# Patient Record
Sex: Female | Born: 1965 | Race: Black or African American | Hispanic: No | Marital: Married | Smoking: Never smoker
Health system: Southern US, Community
[De-identification: ages and names within clinical notes are randomized; demographics above are authoritative.]

## PROBLEM LIST (undated history)

## (undated) ENCOUNTER — Emergency Department (HOSPITAL_COMMUNITY): Admission: EM | Payer: Managed Care, Other (non HMO) | Source: Home / Self Care

## (undated) DIAGNOSIS — E079 Disorder of thyroid, unspecified: Secondary | ICD-10-CM

## (undated) DIAGNOSIS — I10 Essential (primary) hypertension: Secondary | ICD-10-CM

## (undated) HISTORY — PX: HERNIA REPAIR: SHX51

---

## 2016-10-11 ENCOUNTER — Ambulatory Visit
Admission: EM | Admit: 2016-10-11 | Discharge: 2016-10-11 | Disposition: A | Discharge status: Home / Self Care | Payer: BLUE CROSS/BLUE SHIELD | Attending: Family Medicine | Admitting: Family Medicine

## 2016-10-11 ENCOUNTER — Encounter: Payer: Self-pay | Admitting: *Deleted

## 2016-10-11 ENCOUNTER — Ambulatory Visit (INDEPENDENT_AMBULATORY_CARE_PROVIDER_SITE_OTHER): Payer: BLUE CROSS/BLUE SHIELD

## 2016-10-11 DIAGNOSIS — M25562 Pain in left knee: Secondary | ICD-10-CM

## 2016-10-11 DIAGNOSIS — M79671 Pain in right foot: Secondary | ICD-10-CM

## 2016-10-11 DIAGNOSIS — E876 Hypokalemia: Secondary | ICD-10-CM | POA: Diagnosis not present

## 2016-10-11 HISTORY — DX: Essential (primary) hypertension: I10

## 2016-10-11 HISTORY — DX: Disorder of thyroid, unspecified: E07.9

## 2016-10-11 LAB — BASIC METABOLIC PANEL
Anion gap: 7 (ref 5–15)
BUN: 10 mg/dL (ref 6–20)
CO2: 27 mmol/L (ref 22–32)
CREATININE: 0.82 mg/dL (ref 0.44–1.00)
Calcium: 8.9 mg/dL (ref 8.9–10.3)
Chloride: 105 mmol/L (ref 101–111)
GFR calc Af Amer: >60 mL/min (ref >60)
GLUCOSE: 78 mg/dL (ref 65–99)
POTASSIUM: 3.2 mmol/L — AB (ref 3.5–5.1)
Sodium: 139 mmol/L (ref 135–145)

## 2016-10-11 LAB — CBC WITH DIFFERENTIAL/PLATELET
Basophils Absolute: 0.1 10*3/uL (ref 0–0.1)
Basophils Relative: 1 %
EOS PCT: 2 %
Eosinophils Absolute: 0.1 10*3/uL (ref 0–0.7)
HCT: 37.3 % (ref 35.0–47.0)
Hemoglobin: 12.2 g/dL (ref 12.0–16.0)
LYMPHS ABS: 1.8 10*3/uL (ref 1.0–3.6)
LYMPHS PCT: 30 %
MCH: 28.6 pg (ref 26.0–34.0)
MCHC: 32.9 g/dL (ref 32.0–36.0)
MCV: 87.1 fL (ref 80.0–100.0)
MONO ABS: 0.3 10*3/uL (ref 0.2–0.9)
MONOS PCT: 5 %
Neutro Abs: 3.8 10*3/uL (ref 1.4–6.5)
Neutrophils Relative %: 62 %
PLATELETS: 374 10*3/uL (ref 150–440)
RBC: 4.28 MIL/uL (ref 3.80–5.20)
RDW: 14.8 % — AB (ref 11.5–14.5)
WBC: 6.1 10*3/uL (ref 3.6–11.0)

## 2016-10-11 LAB — URIC ACID: Uric Acid, Serum: 5.9 mg/dL (ref 2.3–6.6)

## 2016-10-11 MED ORDER — MELOXICAM 15 MG PO TABS: 15.0000 mg | ORAL_TABLET | Freq: Every day | ORAL | 0 refills | Status: DC | PRN

## 2016-10-11 MED ORDER — OXYCODONE-ACETAMINOPHEN 5-325 MG PO TABS: 1.0000 | ORAL_TABLET | Freq: Three times a day (TID) | ORAL | 0 refills | Status: DC | PRN

## 2016-10-11 MED ORDER — POTASSIUM CHLORIDE CRYS ER 20 MEQ PO TBCR
20.0000 meq | EXTENDED_RELEASE_TABLET | Freq: Once | ORAL | Status: AC
Start: 2016-10-11 — End: 2016-10-11
  Administered 2016-10-11: 20 meq via ORAL

## 2016-10-11 NOTE — ED Triage Notes (Signed)
Right foot pain x 4 days. Lateral aspect. Denies injury. Also left knee pain that is intermittent. Also no injury.

## 2016-10-11 NOTE — ED Provider Notes (Signed)
[facility] ____________________________________________  Time seen: Approximately 3:14 PM  I have reviewed the triage vital signs and the nursing notes.   HISTORY  Chief Complaint Foot Pain   HPI Sonya Lowery is a 51 y.o. female presents with spouse at bedside for the complaints of left knee pain as well as right foot pain. Patient reports for "several weeks "she's been having intermittent left knee pain that comes and goes. Patient reports that after being out in the snow last week, she feels that that called her left knee pain to come back. Patient denies any fall or known injury. Reports has continued to remain ambulatory. Reports that this has been ongoing off and on. Reports intermittent swelling to left lateral knee when pain is present as well as some tightness. Denies persistent pain. Reports ice and over-the-counter indications usually help.  Patient also presents complaining of right foot pain. Patient reports that this is a different pain than what she has been dealing with. Patient reports pain onset to right foot was yesterday upon awakening. Denies any fall, injury or known trigger. Denies any break in skin, redness or change in appearance. Reports some swelling to right lateral foot. Patient reports that the foot is painful even to light touch. Denies any pain radiation. Denies any paresthesias. Reports since pain to right foot, she has had pain and difficulty with walking.  Denies fall or injury. Denies chest pain, shortness of breath, abdominal pain, dysuria, other extremity pain. Denies fevers. Denies any recent hospitalization, immobilization or long trips. Denies history of same in the past. Denies calf pain bilaterally. Denies other she may swelling.  PCP: Fredric Mare  Past Medical History:  Diagnosis Date  . Hypertension   . Thyroid disease     There are no active problems to display for this patient.   History reviewed. No pertinent surgical  history.  Current Outpatient Rx  . Order #: [id number] Class: Historical Med  . Order #: [id number] Class: Historical Med  . Order #: [id number] Class: Historical Med  . Order #: [id number] Class: Historical Med  . Order #: [id number] Class: Normal  . Order #: [id number] Class: Print    No current facility-administered medications for this encounter.   Current Outpatient Prescriptions:  .  ferrous sulfate 325 (65 FE) MG EC tablet, Take 325 mg by mouth 3 (three) times daily with meals., Disp: , Rfl:  .  levothyroxine (SYNTHROID, LEVOTHROID) 175 MCG tablet, Take 175 mcg by mouth daily before breakfast., Disp: , Rfl:  .  levothyroxine (SYNTHROID, LEVOTHROID) 25 MCG tablet, Take 175 mcg by mouth daily before breakfast. , Disp: , Rfl:  .  lisinopril-hydrochlorothiazide (PRINZIDE,ZESTORETIC) 20-25 MG tablet, Take 1 tablet by mouth daily., Disp: , Rfl:  .  meloxicam (MOBIC) 15 MG tablet, Take 1 tablet (15 mg total) by mouth daily as needed for pain., Disp: 10 tablet, Rfl: 0 .  oxyCODONE-acetaminophen (ROXICET) 5-325 MG tablet, Take 1 tablet by mouth every 8 (eight) hours as needed for moderate pain or severe pain (Do not drive or operate heavy machinery while taking as can cause drowsiness.)., Disp: 9 tablet, Rfl: 0  Allergies Patient has no known allergies.  family history Brother: Gout Sister: Graves Disease  Social History Social History  Substance Use Topics  . Smoking status: Never Smoker  . Smokeless tobacco: Never Used  . Alcohol use Yes    Review of Systems Constitutional: No fever/chills Eyes: No visual changes. ENT: No sore throat. Cardiovascular: Denies chest pain. Respiratory: Denies shortness of breath. Gastrointestinal: No abdominal  pain.  No nausea, no vomiting.  No diarrhea.  No constipation. Genitourinary: Negative for dysuria. Musculoskeletal: Negative for back pain. As above.  Skin: Negative for rash. Neurological: Negative for headaches, focal weakness or  numbness.  10-point ROS otherwise negative.  ____________________________________________   PHYSICAL EXAM:  VITAL SIGNS: ED Triage Vitals  Enc Vitals Group     BP 10/11/16 1359 (!) 165/103     Pulse Rate 10/11/16 1359 76     Resp 10/11/16 1359 16     Temp 10/11/16 1359 98.5 F (36.9 C)     Temp Source 10/11/16 1359 Oral     SpO2 10/11/16 1359 100 %     Weight 10/11/16 1401 208 lb (94.3 kg)     Height 10/11/16 1401 5\' 9"  (1.753 m)     Head Circumference --      Peak Flow --      Pain Score --      Pain Loc --      Pain Edu? --      Excl. in GC? --     Constitutional: Alert and oriented. Well appearing and in no acute distress. Eyes: Conjunctivae are normal. PERRL. EOMI. ENT      Head: Normocephalic and atraumatic.      Mouth/Throat: Mucous membranes are moist. Neck: No stridor. Supple without meningismus.  Hematological/Lymphatic/Immunilogical: No cervical lymphadenopathy. Cardiovascular: Normal rate, regular rhythm. Grossly normal heart sounds.  Good peripheral circulation. Respiratory: Normal respiratory effort without tachypnea nor retractions. Breath sounds are clear and equal bilaterally. No wheezes/rales/rhonchi.. Musculoskeletal: No midline cervical, thoracic or lumbar tenderness to palpation. Bilateral pedal pulses equal and easily palpated.  except: Mild tenderness to palpation left anterior knee, no pain with anterior or posterior drawer test, no posterior tenderness to palpation, no calf tenderness to palpation, mild pain with lateral stress, no pain with medial stress, no pain with resisted flexion or extension, no effusion palpated, no erythema, no ecchymosis, full range of motion present. Except: Right dorsal foot at base of great toe and along the dorsal foot to lateral side, mild to moderate tenderness to light touch with diffuse tenderness, no erythema, minimal swelling, full range of motion present, normal distal sensation and capillary refill to right foot,  right lower extremity otherwise nontender. No calf tenderness bilaterally. Bilateral pedal pulses equal and easily palpated. Neurologic:  Normal speech and language. Speech is normal. No gait instability.  Skin:  Skin is warm, dry and intact. No rash noted. Psychiatric: Mood and affect are normal. Speech and behavior are normal. Patient exhibits appropriate insight and judgment   ___________________________________________   LABS (all labs ordered are listed, but only abnormal results are displayed)  Labs Reviewed  CBC WITH DIFFERENTIAL/PLATELET - Abnormal; Notable for the following:       Result Value   RDW 14.8 (*)    All other components within normal limits  BASIC METABOLIC PANEL - Abnormal; Notable for the following:    Potassium 3.2 (*)    All other components within normal limits  URIC ACID   ________________________________________  RADIOLOGY  Dg Knee Complete 4 Views Left  Result Date: 10/11/2016 CLINICAL DATA:  Pain and swelling of lateral left knee. EXAM: LEFT KNEE - COMPLETE 4+ VIEW COMPARISON:  None. FINDINGS: No evidence of fracture or dislocation. There may be a small suprapatellar joint effusion. No evidence of arthropathy or other focal bone abnormality. Soft tissues are unremarkable. IMPRESSION: No significant findings. Possible small suprapatellar joint effusion. Electronically Signed   By: Sherrine MaplesGlenn  Fredia Sorrow M.D.   On: 10/11/2016 16:14   Dg Foot Complete Right  Result Date: 10/11/2016 CLINICAL DATA:  Lateral forefoot pain for 1 day.  No acute injury. EXAM: RIGHT FOOT COMPLETE - 3+ VIEW COMPARISON:  None. FINDINGS: The mineralization and alignment are normal. There is no evidence of acute fracture or dislocation. No evidence of stress fracture. Probable incidental exostosis involving the medial shaft of the third metatarsal. The joint spaces are maintained. No focal soft tissue abnormalities are seen. IMPRESSION: No acute findings or explanation for the patient's  symptoms. Electronically Signed   By: Carey Bullocks M.D.   On: 10/11/2016 16:07   ____________________________________________   PROCEDURES Procedures    INITIAL IMPRESSION / ASSESSMENT AND PLAN / ED COURSE  Pertinent labs & imaging results that were available during my care of the patient were reviewed by me and considered in my medical decision making (see chart for details).  Very well-appearing patient. No acute distress. Patient with left anterior lateral knee pain has been coming and going intermittently for several weeks. Also reports right foot pain onset 2 days ago causing difficulty with walking, as well as denies trigger. Denies any injury. Will evaluate by x-ray. Discussed with patient evaluation of ultrasound to lower extremities, patient declines ultrasound at this time.  Per radiologist's left knee x-ray no significant findings, possible small suprapatellar joint effusion. Per radiologist's right foot x-ray no acute findings or recommendations the patient's symptoms. Labs reviewed. Discussed with patient. Potassium 3.2, 20 mEq of potassium given once in urgent care, and counseled regarding follow-up. Discussed in detail with patient suspect left knee pain strain injury, and encourage rest, ice and elevation as well as follow-up with orthopedic.   Suspect inflammatory pain to right foot, and discuss possibility of gout. Patient does report family history of gout in her siblings. Will start patient on oral Mobic as well as when necessary Percocet as needed for breakthrough pain. [place] controlled substance database reviewed, no controlled substances documented in last 6 months.Discussed indication, risks and benefits of medications with patient. Crutches given. Also, counseled regarding monitoring blood pressure and follow up.   Patient states that she has a follow-up appointment with her primary care physician tomorrow, encourage to keep appointment. . Discussed follow up  and return parameters including no resolution or any worsening concerns. Patient verbalized understanding and agreed to plan.   ____________________________________________   FINAL CLINICAL IMPRESSION(S) / ED DIAGNOSES  Final diagnoses:  Acute pain of left knee  Right foot pain  Hypokalemia     Discharge Medication List as of 10/11/2016  4:35 PM    START taking these medications   Details  meloxicam (MOBIC) 15 MG tablet Take 1 tablet (15 mg total) by mouth daily as needed for pain., Starting Mon 10/11/2016, Normal    oxyCODONE-acetaminophen (ROXICET) 5-325 MG tablet Take 1 tablet by mouth every 8 (eight) hours as needed for moderate pain or severe pain (Do not drive or operate heavy machinery while taking as can cause drowsiness.)., Starting Mon 10/11/2016, Print        Note: This dictation was prepared with Dragon dictation along with smaller phrase technology. Any transcriptional errors that result from this process are unintentional.         Renford Dills, NP 10/11/16 1818

## 2016-10-11 NOTE — Discharge Instructions (Signed)
Take medication as prescribed. Rest. Drink plenty of fluids. Elevate.  Follow up with your primary care physician this week as scheduled. Return to Urgent care for new or worsening concerns.

## 2018-02-27 IMAGING — CR DG KNEE COMPLETE 4+V*L*
4 series · 4 of 4 positions shown · non-contrast
Comparison: None.

CLINICAL DATA: Pain and swelling of lateral left knee.

EXAM:
LEFT KNEE - COMPLETE 4+ VIEW

[knee ap]
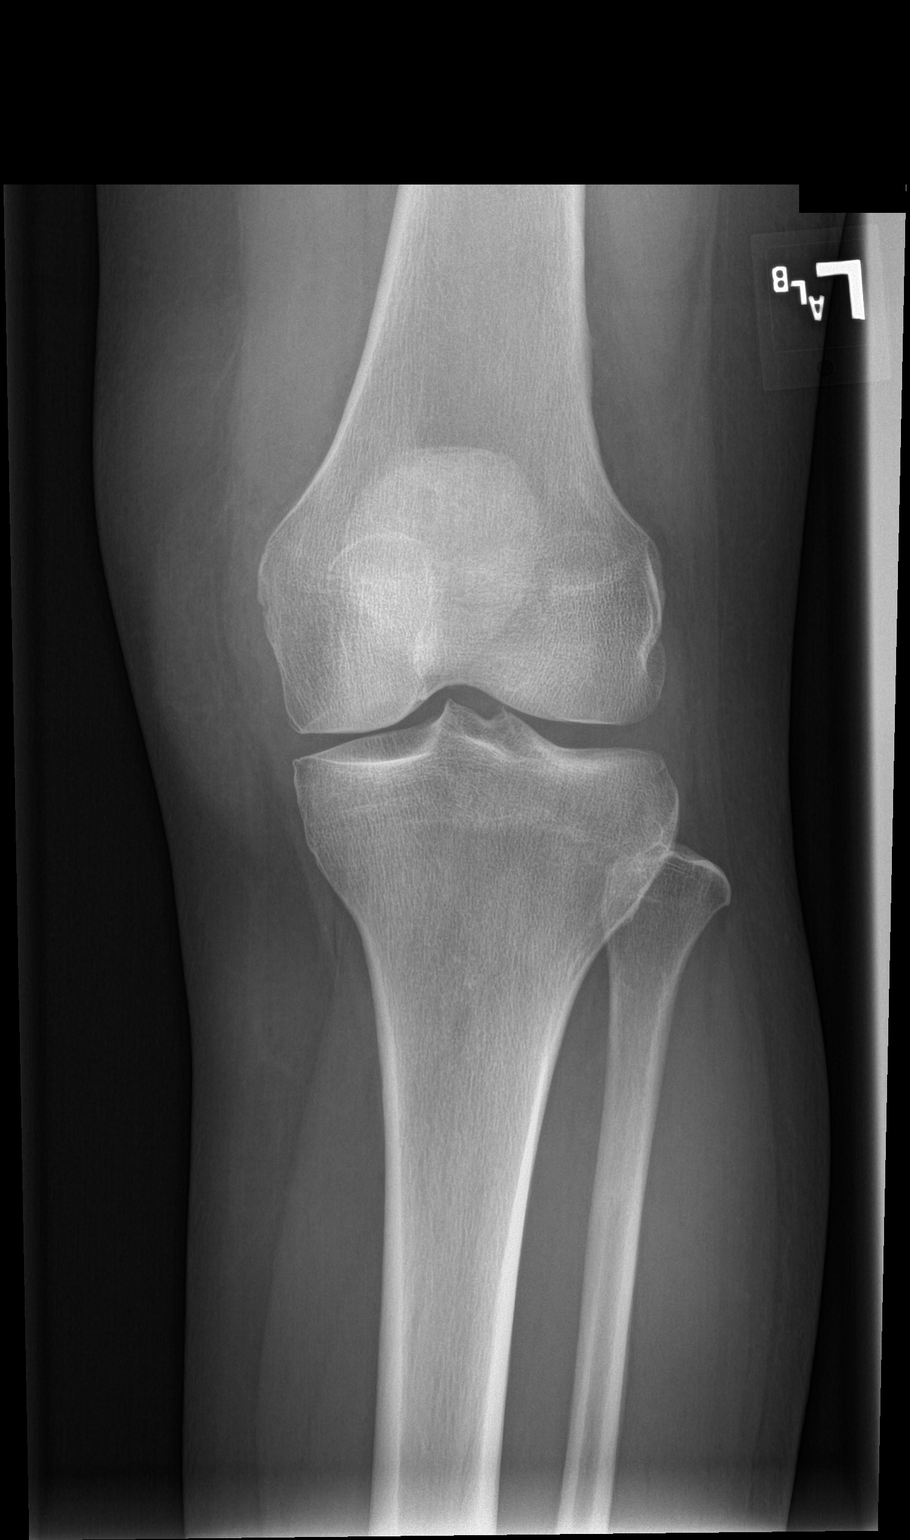

[knee lat]
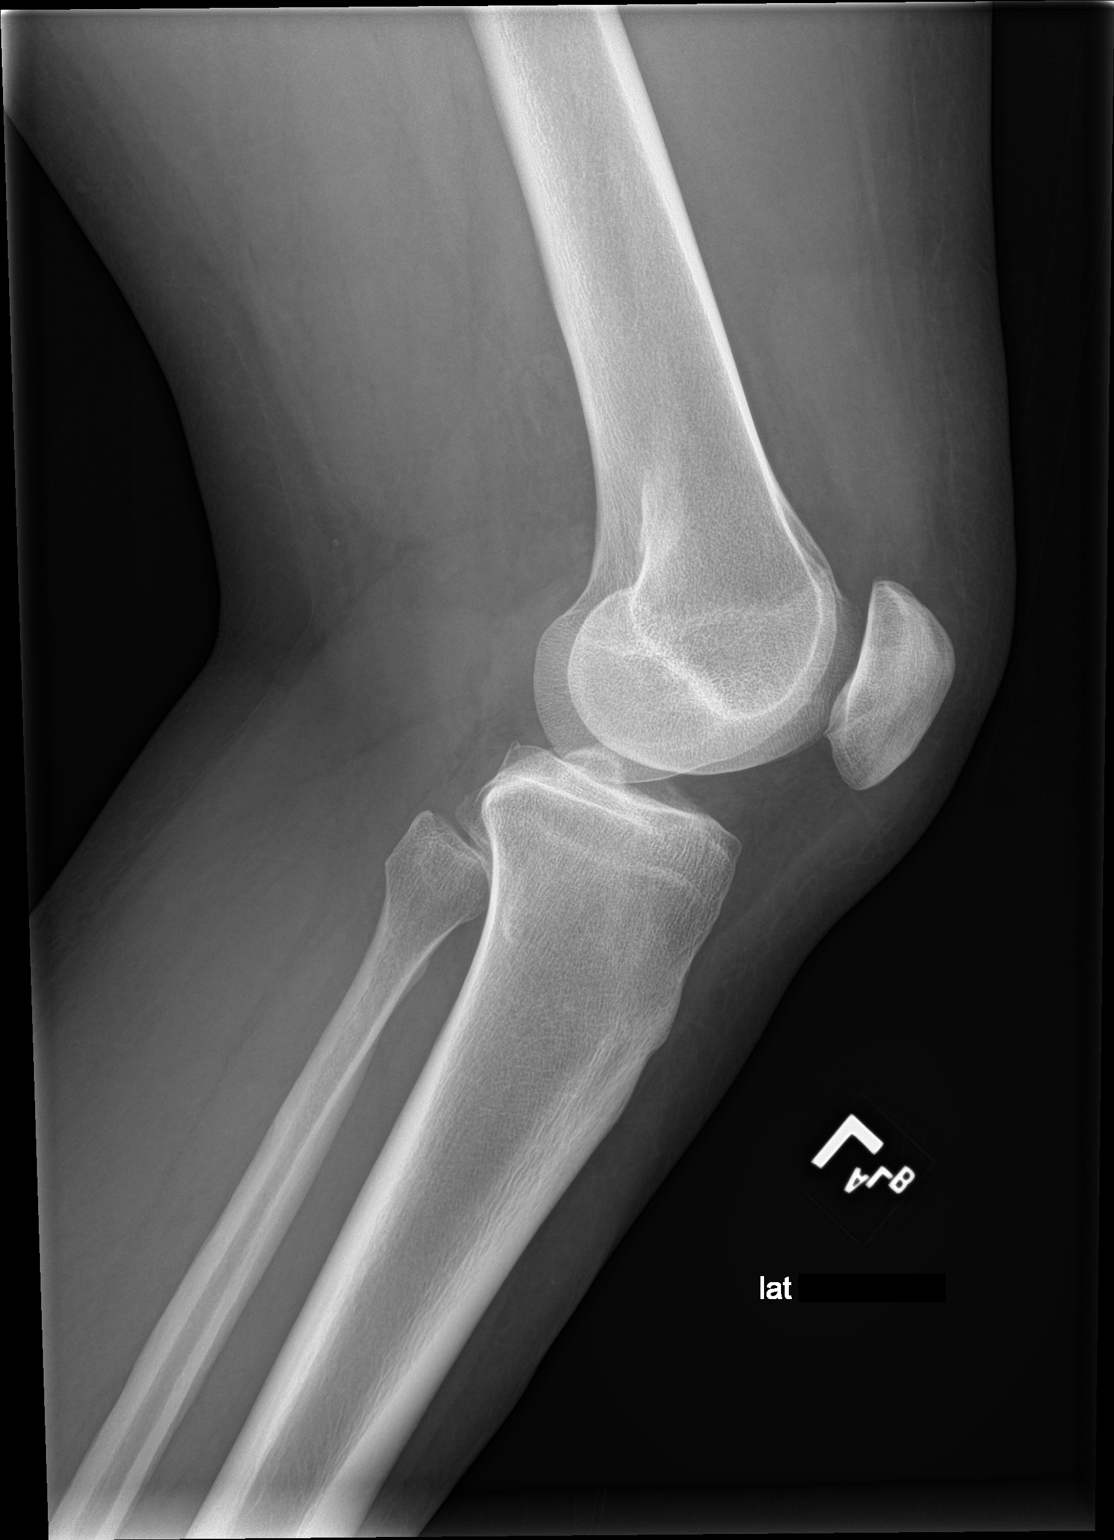

[tunnel]
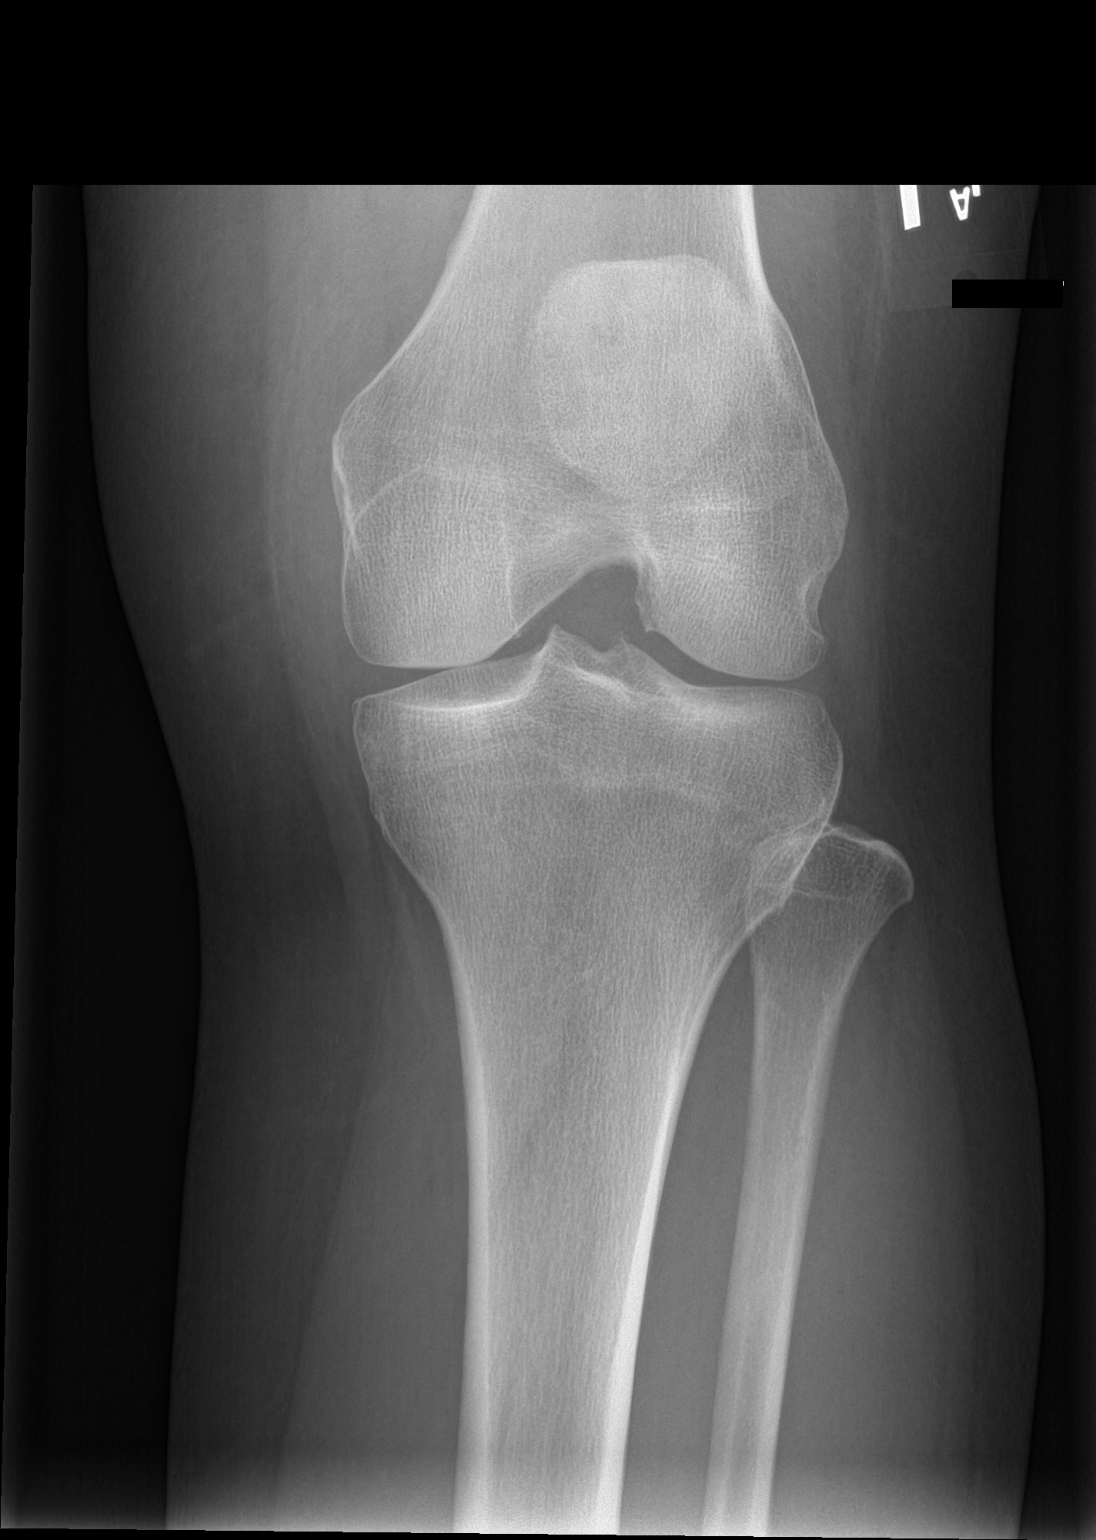

[patella skyline]
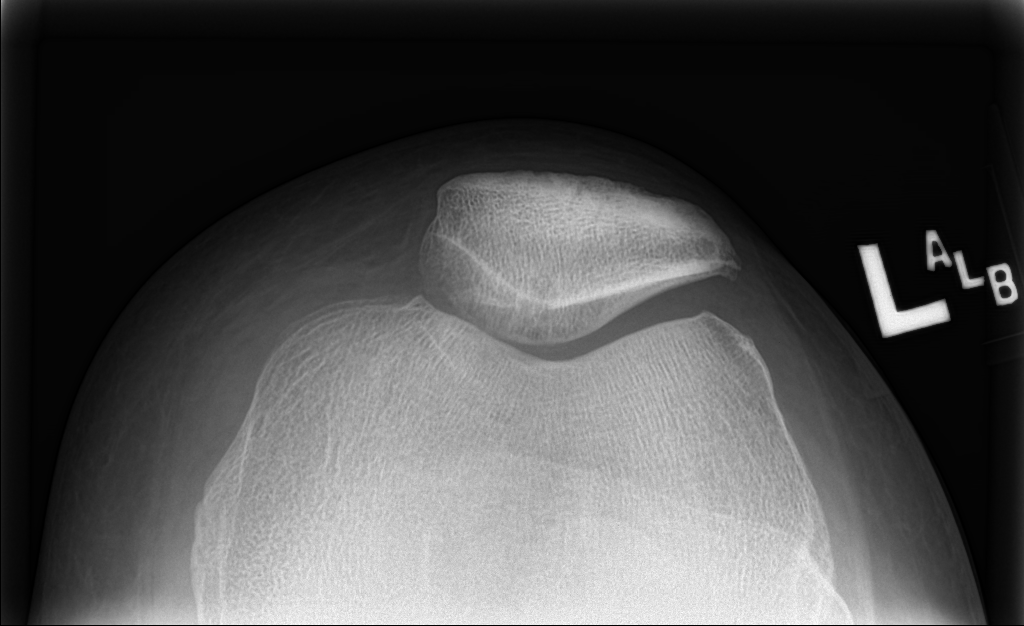

[4 of 4 positions shown; findings below may reference images not displayed]

FINDINGS: No evidence of fracture or dislocation. There may be a small
suprapatellar joint effusion. No evidence of arthropathy or other
focal bone abnormality. Soft tissues are unremarkable.
IMPRESSION: No significant findings. Possible small suprapatellar joint
effusion.

## 2019-09-10 ENCOUNTER — Emergency Department: Payer: 59

## 2019-09-10 ENCOUNTER — Encounter: Payer: Self-pay | Admitting: Emergency Medicine

## 2019-09-10 ENCOUNTER — Emergency Department
Admission: EM | Admit: 2019-09-10 | Discharge: 2019-09-10 | Disposition: A | Discharge status: Home / Self Care | Payer: 59 | Attending: Emergency Medicine | Admitting: Emergency Medicine

## 2019-09-10 ENCOUNTER — Other Ambulatory Visit: Payer: Self-pay

## 2019-09-10 DIAGNOSIS — I10 Essential (primary) hypertension: Secondary | ICD-10-CM | POA: Diagnosis not present

## 2019-09-10 DIAGNOSIS — R519 Headache, unspecified: Secondary | ICD-10-CM | POA: Insufficient documentation

## 2019-09-10 DIAGNOSIS — Z79899 Other long term (current) drug therapy: Secondary | ICD-10-CM | POA: Diagnosis not present

## 2019-09-10 LAB — CBC
HCT: 39 % (ref 36.0–46.0)
Hemoglobin: 12.9 g/dL (ref 12.0–15.0)
MCH: 29.1 pg (ref 26.0–34.0)
MCHC: 33.1 g/dL (ref 30.0–36.0)
MCV: 88 fL (ref 80.0–100.0)
Platelets: 342 10*3/uL (ref 150–400)
RBC: 4.43 MIL/uL (ref 3.87–5.11)
RDW: 12.9 % (ref 11.5–15.5)
WBC: 5.8 10*3/uL (ref 4.0–10.5)
nRBC: 0 % (ref 0.0–0.2)

## 2019-09-10 LAB — BASIC METABOLIC PANEL
Anion gap: 8 (ref 5–15)
BUN: 13 mg/dL (ref 6–20)
CO2: 28 mmol/L (ref 22–32)
Calcium: 9.3 mg/dL (ref 8.9–10.3)
Chloride: 104 mmol/L (ref 98–111)
Creatinine, Ser: 0.9 mg/dL (ref 0.44–1.00)
GFR calc Af Amer: >60 mL/min (ref >60)
GFR calc non Af Amer: >60 mL/min (ref >60)
Glucose, Bld: 115 mg/dL — ABNORMAL HIGH (ref 70–99)
Potassium: 3.6 mmol/L (ref 3.5–5.1)
Sodium: 140 mmol/L (ref 135–145)

## 2019-09-10 LAB — TROPONIN I (HIGH SENSITIVITY): Troponin I (High Sensitivity): <2 ng/L (ref <18)

## 2019-09-10 MED ORDER — CLONIDINE HCL 0.1 MG PO TABS
0.2000 mg | ORAL_TABLET | Freq: Once | ORAL | Status: AC
  Administered 2019-09-10: 0.2 mg via ORAL
  Filled 2019-09-10: qty 2

## 2019-09-10 NOTE — ED Notes (Signed)
See triage note   Presents with intermittent chest discomfort and elevated b/p   States sxs' started about 1 month ago

## 2019-09-10 NOTE — ED Triage Notes (Signed)
Reports hypertension X 1 month, intermittent CP to right side Saturday and Sunday. Headache today. Complaint with BP medications. Pt alert and oriented X4, cooperative, RR even and unlabored, color WNL. Pt in NAD.

## 2019-09-10 NOTE — ED Notes (Signed)
This RN went in to check pt's vitals to find no one in the room. EDP made aware that pt left without telling anyone.

## 2019-09-10 NOTE — ED Provider Notes (Signed)
[facility] Emergency Department Provider Note ____________________________________________   First MD Initiated Contact with Patient 09/10/19 1858     (approximate)  I have reviewed the triage vital signs and the nursing notes.   HISTORY  Chief Complaint Hypertension, Headache (Head "Pains"), and Chest Pain  HPI Sonya Lowery is a 53 y.o. female presenting to the emergency department for treatment and evaluation of hypertension x1 month with intermittent chest pain on the right side yesterday and the day before.  Today she had a headache.  She is compliant with her lisinopril hydrochlorothiazide.    Past Medical History:  Diagnosis Date  . Hypertension   . Thyroid disease     There are no problems to display for this patient.   History reviewed. No pertinent surgical history.  Prior to Admission medications   Medication Sig Start Date End Date Taking? Authorizing Provider  ferrous sulfate 325 (65 FE) MG EC tablet Take 325 mg by mouth 3 (three) times daily with meals.    [provider]  levothyroxine (SYNTHROID, LEVOTHROID) 175 MCG tablet Take 175 mcg by mouth daily before breakfast.    [provider]  levothyroxine (SYNTHROID, LEVOTHROID) 25 MCG tablet Take 175 mcg by mouth daily before breakfast.     [provider]  lisinopril-hydrochlorothiazide (PRINZIDE,ZESTORETIC) 20-25 MG tablet Take 1 tablet by mouth daily.    [provider]    Allergies Patient has no known allergies.  No family history on file.  Social History Social History   Tobacco Use  . Smoking status: Never Smoker  . Smokeless tobacco: Never Used  Substance Use Topics  . Alcohol use: Yes  . Drug use: Not on file    Review of Systems  Constitutional: No fever/chills Eyes: No visual changes. ENT: No sore throat. Cardiovascular: Chest pain yesterday, none today. Respiratory: Denies shortness of breath. Gastrointestinal: No  abdominal pain.  No nausea, no vomiting.  No diarrhea.  No constipation. Genitourinary: Negative for dysuria. Musculoskeletal: Negative for back pain. Skin: Negative for rash. Neurological: Positive for headaches, negative for focal weakness or numbness. ____________________________________________   PHYSICAL EXAM:  VITAL SIGNS: ED Triage Vitals  Enc Vitals Group     BP 09/10/19 1651 (!) 154/91     Pulse Rate 09/10/19 1651 72     Resp 09/10/19 1651 20     Temp 09/10/19 1651 97.8 F (36.6 C)     Temp Source 09/10/19 1651 Oral     SpO2 09/10/19 1651 100 %     Weight 09/10/19 1650 233 lb (105.7 kg)     Height 09/10/19 1650 5\' 9"  (1.753 m)     Head Circumference --      Peak Flow --      Pain Score 09/10/19 1656 3     Pain Loc --      Pain Edu? --      Excl. in GC? --     Constitutional: Alert and oriented. Well appearing and in no acute distress. Eyes: Conjunctivae are normal. PERRL. EOMI. Head: Atraumatic. Nose: No congestion/rhinnorhea. Mouth/Throat: Mucous membranes are moist.  Oropharynx non-erythematous. Neck: No stridor.   Hematological/Lymphatic/Immunilogical: No cervical lymphadenopathy. Cardiovascular: Normal rate, regular rhythm. Grossly normal heart sounds.  Good peripheral circulation. Respiratory: Normal respiratory effort.  No retractions. Lungs CTAB. Gastrointestinal: Soft and nontender. No distention. No abdominal bruits. No CVA tenderness. Genitourinary:  Musculoskeletal: No lower extremity tenderness nor edema.  No joint effusions. Neurologic:  Normal speech and language. No gross focal  neurologic deficits are appreciated. No gait instability. Skin:  Skin is warm, dry and intact. No rash noted. Psychiatric: Mood and affect are normal. Speech and behavior are normal.  ____________________________________________   LABS (all labs ordered are listed, but only abnormal results are displayed)  Labs Reviewed  BASIC METABOLIC PANEL - Abnormal; Notable for  the following components:      Result Value   Glucose, Bld 115 (*)    All other components within normal limits  CBC  TROPONIN I (HIGH SENSITIVITY)  TROPONIN I (HIGH SENSITIVITY)   ____________________________________________  EKG  ED ECG REPORT I, Arles Rumbold, FNP-BC personally viewed and interpreted this ECG.   Date: 09/10/2019  EKG Time: 1659  Rate: 74  Rhythm: normal EKG, normal sinus rhythm  Axis: normal  Intervals:none  ST&T Change: no ST elevation  ____________________________________________  RADIOLOGY  ED MD interpretation:    Chest x-ray negative for acute findings per radiology.  Official radiology report(s): DG Chest 2 View  Result Date: 09/10/2019 CLINICAL DATA:  Hypertension for 1 months. Right chest pain. Headache. EXAM: CHEST - 2 VIEW COMPARISON:  None. FINDINGS: The heart size and mediastinal contours are within normal limits. Both lungs are clear. The visualized skeletal structures are unremarkable. IMPRESSION: No active cardiopulmonary disease. Electronically Signed   By: Van Clines M.D.   On: 09/10/2019 17:24    ____________________________________________   PROCEDURES  Procedure(s) performed (including Critical Care):  Procedures  ____________________________________________   INITIAL IMPRESSION / ASSESSMENT AND PLAN     53 year old female presenting to the emergency department for treatment and evaluation of hypertension with headache today.  See HPI for further details.  While awaiting room assignment, labs were drawn and are reassuring.  Chest x-ray is also reassuring.  Plan will be to administer clonidine 0.2 mg and see if the blood pressure responds well and see if she feels better.  DIFFERENTIAL DIAGNOSIS  Hypertension, hypertensive urgency, cardiac event.  ED COURSE  Clonidine was administered and when the nurse went back to recheck vital signs, the patient was not found to be in the room.  ____________________________________________   FINAL CLINICAL IMPRESSION(S) / ED DIAGNOSES  Final diagnoses:  Hypertension, unspecified type     ED Discharge Orders    None       Note:  This document was prepared using Dragon voice recognition software and may include unintentional dictation errors.   Victorino Dike, FNP 09/10/19 1443    Nance Pear, MD 09/10/19 [phone]

## 2021-01-26 IMAGING — CR DG CHEST 2V
2 series · 2 of 2 positions shown · non-contrast
Comparison: None.

CLINICAL DATA: Hypertension for 1 months. Right chest pain.
Headache.

EXAM:
CHEST - 2 VIEW

[chest pa]
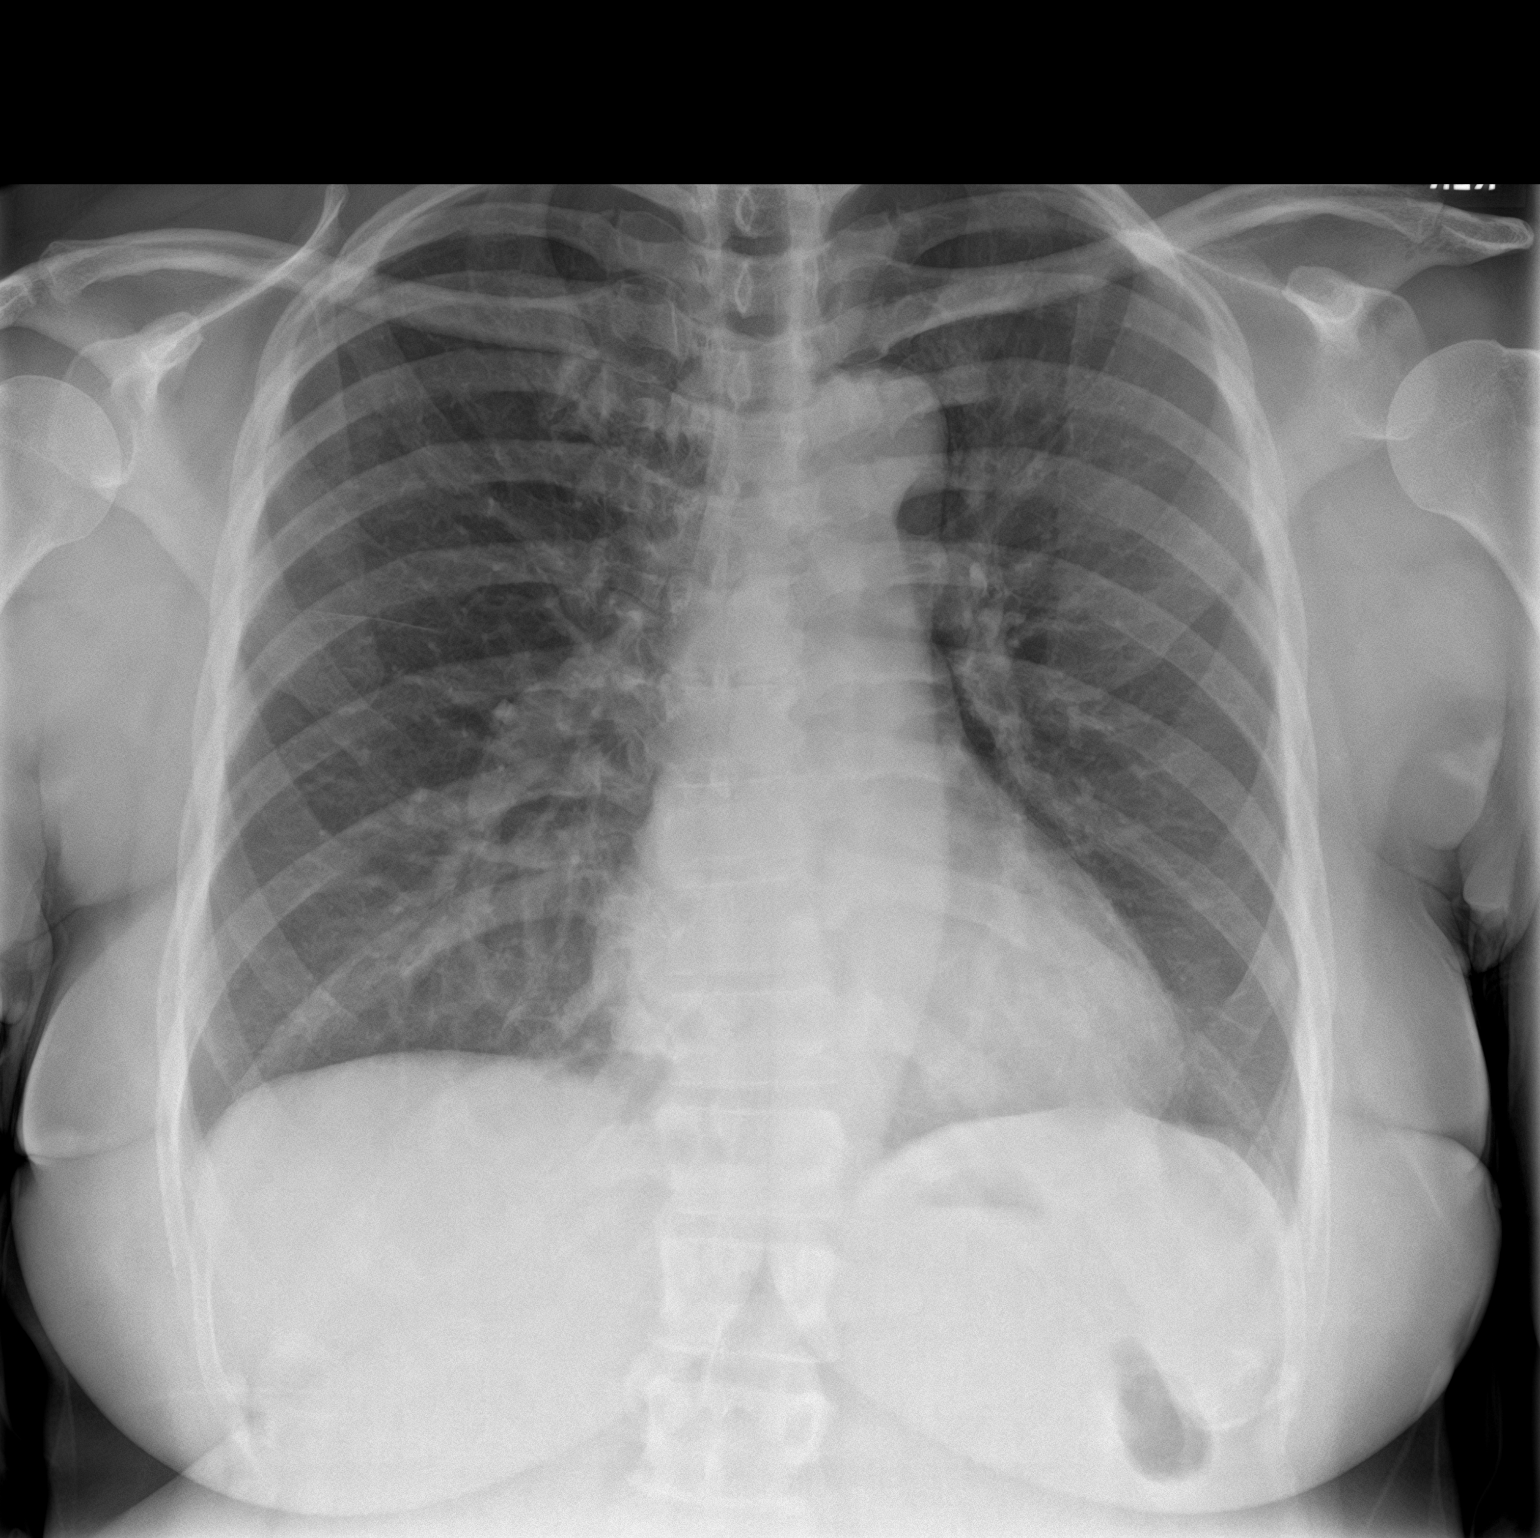

[chest lat]
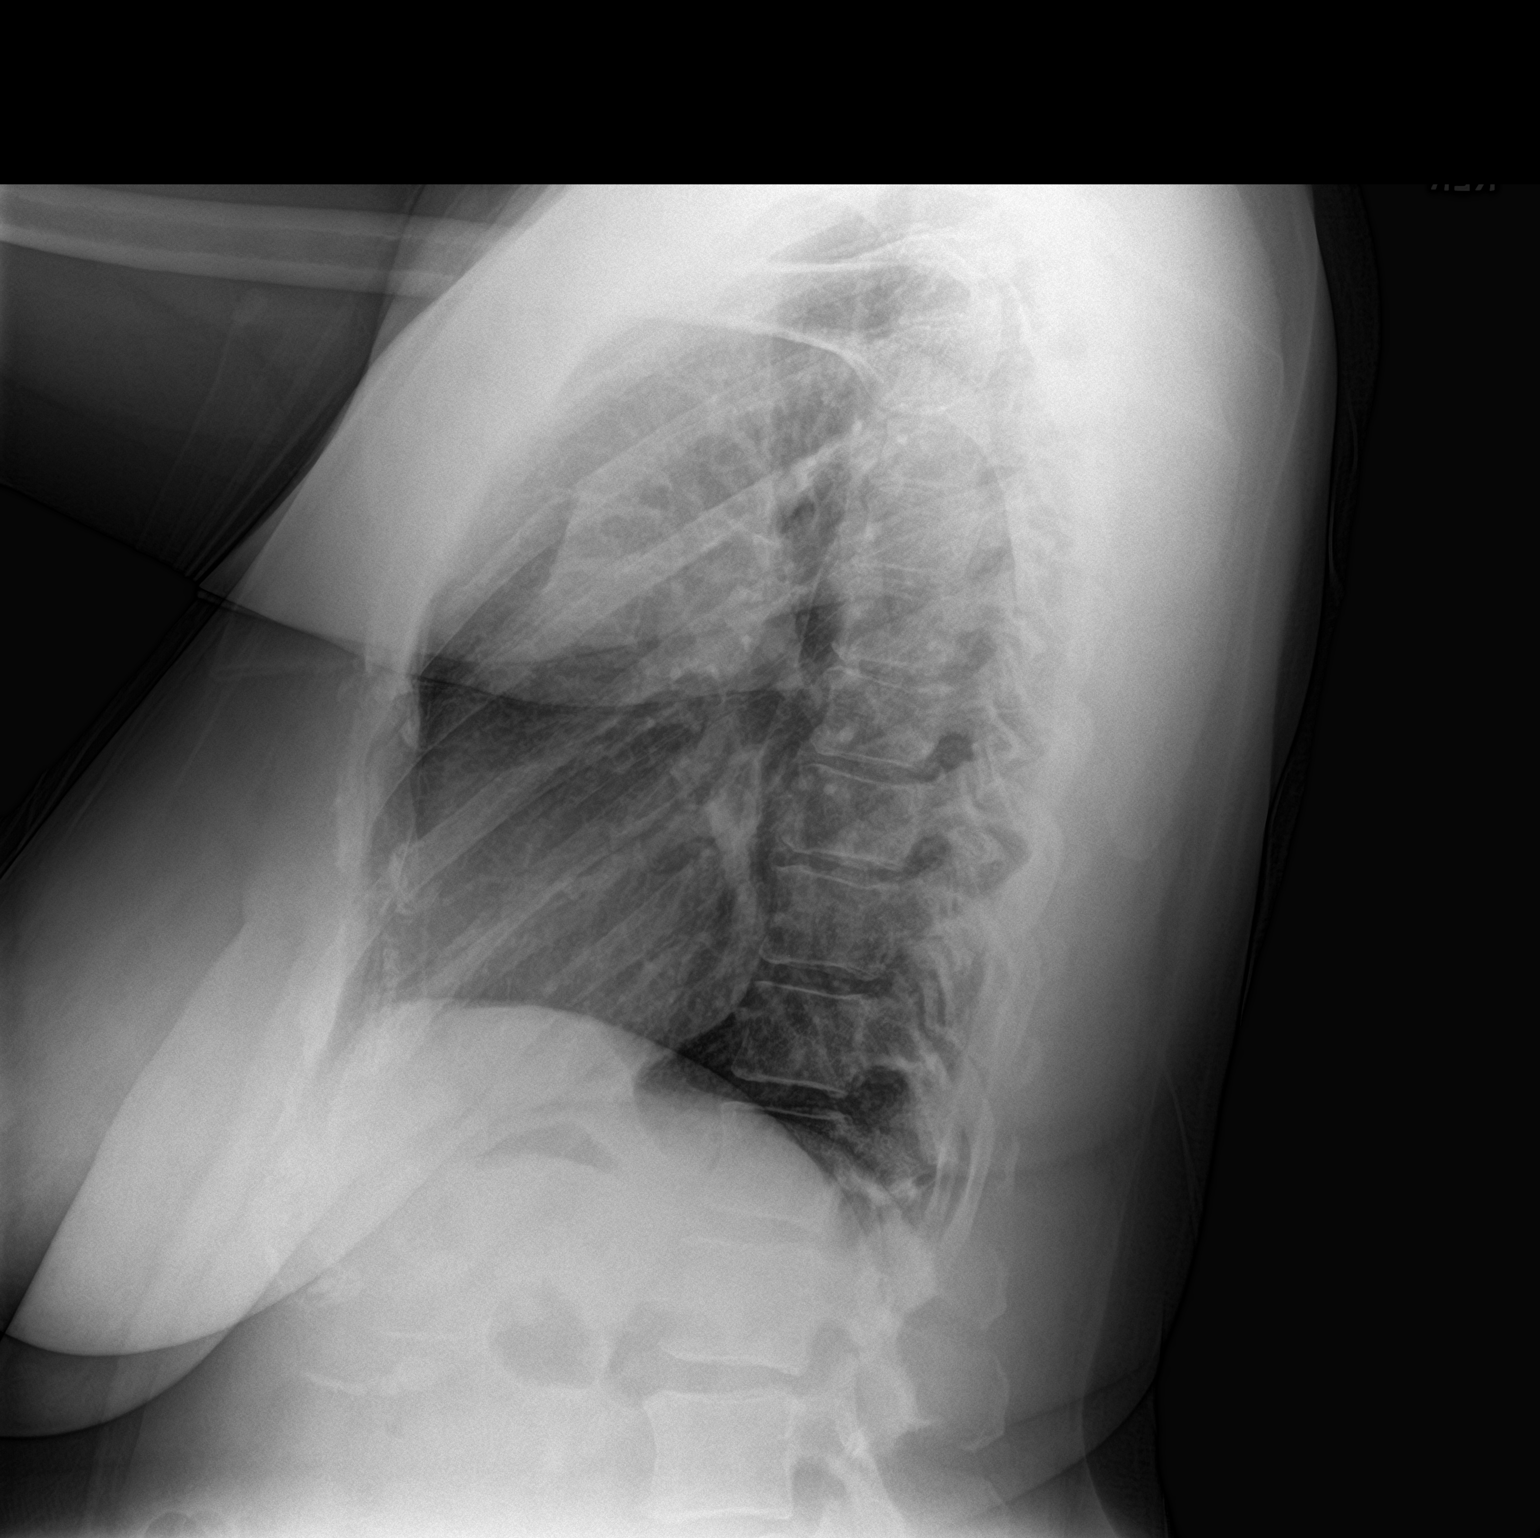

[2 of 2 positions shown; findings below may reference images not displayed]

FINDINGS: The heart size and mediastinal contours are within normal limits.
Both lungs are clear. The visualized skeletal structures are
unremarkable.
IMPRESSION: No active cardiopulmonary disease.

## 2021-05-15 ENCOUNTER — Other Ambulatory Visit: Payer: Self-pay

## 2021-05-15 ENCOUNTER — Emergency Department
Admission: EM | Admit: 2021-05-15 | Discharge: 2021-05-15 | Disposition: A | Discharge status: Home / Self Care | Payer: Managed Care, Other (non HMO) | Attending: Emergency Medicine | Admitting: Emergency Medicine

## 2021-05-15 ENCOUNTER — Emergency Department: Payer: Managed Care, Other (non HMO)

## 2021-05-15 ENCOUNTER — Encounter: Payer: Self-pay | Admitting: Emergency Medicine

## 2021-05-15 DIAGNOSIS — S93601A Unspecified sprain of right foot, initial encounter: Secondary | ICD-10-CM

## 2021-05-15 DIAGNOSIS — Y9389 Activity, other specified: Secondary | ICD-10-CM | POA: Diagnosis not present

## 2021-05-15 DIAGNOSIS — M79671 Pain in right foot: Secondary | ICD-10-CM | POA: Diagnosis not present

## 2021-05-15 DIAGNOSIS — I10 Essential (primary) hypertension: Secondary | ICD-10-CM | POA: Diagnosis not present

## 2021-05-15 DIAGNOSIS — X501XXA Overexertion from prolonged static or awkward postures, initial encounter: Secondary | ICD-10-CM | POA: Insufficient documentation

## 2021-05-15 DIAGNOSIS — Z79899 Other long term (current) drug therapy: Secondary | ICD-10-CM | POA: Insufficient documentation

## 2021-05-15 DIAGNOSIS — S99921A Unspecified injury of right foot, initial encounter: Secondary | ICD-10-CM | POA: Diagnosis present

## 2021-05-15 NOTE — ED Provider Notes (Signed)
[facility] Emergency Department Provider Note  ____________________________________________   Event Date/Time   First MD Initiated Contact with Patient 05/15/21 [phone]     (approximate)  I have reviewed the triage vital signs and the nursing notes.   HISTORY  Chief Complaint Foot Injury    HPI Sonya Lowery is a 55 y.o. female complains of right foot pain.  Patient states that she twisted her foot 2 days ago.  Having pain across the midfoot into the side.  Piercing pain from underneath.  States minimally swollen.  Is still able to wear shoes.  No other injuries reported  Past Medical History:  Diagnosis Date   Hypertension    Thyroid disease     There are no problems to display for this patient.   Past Surgical History:  Procedure Laterality Date   HERNIA REPAIR      Prior to Admission medications   Medication Sig Start Date End Date Taking? Authorizing Provider  ferrous sulfate 325 (65 FE) MG EC tablet Take 325 mg by mouth 3 (three) times daily with meals.    [provider]  levothyroxine (SYNTHROID, LEVOTHROID) 175 MCG tablet Take 175 mcg by mouth daily before breakfast.    [provider]  levothyroxine (SYNTHROID, LEVOTHROID) 25 MCG tablet Take 175 mcg by mouth daily before breakfast.     [provider]  lisinopril-hydrochlorothiazide (PRINZIDE,ZESTORETIC) 20-25 MG tablet Take 1 tablet by mouth daily.    [provider]    Allergies Patient has no known allergies.  No family history on file.  Social History Social History   Tobacco Use   Smoking status: Never   Smokeless tobacco: Never  Substance Use Topics   Alcohol use: Yes    Review of Systems  Constitutional: No fever/chills Eyes: No visual changes. ENT: No sore throat. Respiratory: Denies cough Genitourinary: Negative for dysuria. Musculoskeletal: Negative for back pain.  Positive for right foot pain Skin: Negative for  rash. Psychiatric: no mood changes,     ____________________________________________   PHYSICAL EXAM:  VITAL SIGNS: ED Triage Vitals  Enc Vitals Group     BP 05/15/21 0905 (!) 158/102     Pulse Rate 05/15/21 0905 81     Resp 05/15/21 0905 17     Temp --      Temp src --      SpO2 05/15/21 0905 100 %     Weight 05/15/21 0905 220 lb (99.8 kg)     Height 05/15/21 0905 5' 9.5" (1.765 m)     Head Circumference --      Peak Flow --      Pain Score 05/15/21 0904 8     Pain Loc --      Pain Edu? --      Excl. in GC? --     Constitutional: Alert and oriented. Well appearing and in no acute distress. Eyes: Conjunctivae are normal.  Head: Atraumatic. Nose: No congestion/rhinnorhea. Mouth/Throat: Mucous membranes are moist.   Neck:  supple no lymphadenopathy noted Cardiovascular: Normal rate, regular rhythm.  Respiratory: Normal respiratory effort.  No retractions, l GU: deferred Musculoskeletal: FROM all extremities, warm and well perfused, right foot is tender across the midfoot and at the plantar aspect, neurovascular is intact Neurologic:  Normal speech and language.  Skin:  Skin is warm, dry and intact. No rash noted. Psychiatric: Mood and affect are normal. Speech and behavior are normal.  ____________________________________________   LABS (all labs ordered are listed, but  only abnormal results are displayed)  Labs Reviewed - No data to display ____________________________________________   ____________________________________________  RADIOLOGY  X-ray of the right foot  ____________________________________________   PROCEDURES  Procedure(s) performed: Postop shoe Ace wrap applied by nursing   Procedures    ____________________________________________   INITIAL IMPRESSION / ASSESSMENT AND PLAN / ED COURSE  Pertinent labs & imaging results that were available during my care of the patient were reviewed by me and considered in my medical decision  making (see chart for details).   Patient is a 55 year old female presents emergency department with right foot pain after an injury.  X-ray of the right foot ordered to rule out fracture  X-ray of the right foot reviewed by me confirmed by radiology be negative for any acute bony abnormality.  Patient was placed in a postop shoe and Ace wrap.  She is to elevate and ice.  Return emergency department worsening.  Follow-up podiatry if not better in 1 week.  She was discharged stable condition.    Sonya Lowery was evaluated in Emergency Department on 05/15/2021 for the symptoms described in the history of present illness. She was evaluated in the context of the global COVID-19 pandemic, which necessitated consideration that the patient might be at risk for infection with the SARS-CoV-2 virus that causes COVID-19. Institutional protocols and algorithms that pertain to the evaluation of patients at risk for COVID-19 are in a state of rapid change based on information released by regulatory bodies including the CDC and federal and state organizations. These policies and algorithms were followed during the patient's care in the ED.    As part of my medical decision making, I reviewed the following data within the electronic MEDICAL RECORD NUMBER Nursing notes reviewed and incorporated, Old chart reviewed, Radiograph reviewed , Notes from prior ED visits, and [place] Controlled Substance Database  ____________________________________________   FINAL CLINICAL IMPRESSION(S) / ED DIAGNOSES  Final diagnoses:  Sprain of right foot, initial encounter      NEW MEDICATIONS STARTED DURING THIS VISIT:  New Prescriptions   No medications on file     Note:  This document was prepared using Dragon voice recognition software and may include unintentional dictation errors.    Faythe Ghee, PA-C 05/15/21 [phone]    Jene Every, MD 05/15/21 [phone]

## 2021-05-15 NOTE — ED Triage Notes (Signed)
C/O injuring right foot 2 days ago while playing with grandson.  States twisted foot and feel off of shoe.  AAOx3.  Skin warm and dry. NAD. Ambulatory

## 2021-05-15 NOTE — Discharge Instructions (Addendum)
Follow up with [facility] clinic podiatry as needed Wear the orthopedic shoe for 5 days Elevate and ice Take tylenol or ibuprofen for pain as needed

## 2021-08-17 ENCOUNTER — Encounter: Payer: Self-pay | Admitting: Emergency Medicine

## 2021-08-17 ENCOUNTER — Emergency Department
Admission: EM | Admit: 2021-08-17 | Discharge: 2021-08-17 | Disposition: A | Discharge status: Home / Self Care | Payer: Managed Care, Other (non HMO) | Attending: Emergency Medicine | Admitting: Emergency Medicine

## 2021-08-17 DIAGNOSIS — Z79899 Other long term (current) drug therapy: Secondary | ICD-10-CM | POA: Insufficient documentation

## 2021-08-17 DIAGNOSIS — R42 Dizziness and giddiness: Secondary | ICD-10-CM | POA: Insufficient documentation

## 2021-08-17 DIAGNOSIS — I10 Essential (primary) hypertension: Secondary | ICD-10-CM | POA: Diagnosis not present

## 2021-08-17 LAB — URINALYSIS, ROUTINE W REFLEX MICROSCOPIC
Bilirubin Urine: NEGATIVE
Glucose, UA: NEGATIVE mg/dL
Hgb urine dipstick: NEGATIVE
Ketones, ur: NEGATIVE mg/dL
Leukocytes,Ua: NEGATIVE
Nitrite: NEGATIVE
Protein, ur: NEGATIVE mg/dL
Specific Gravity, Urine: 1.02 (ref 1.005–1.030)
pH: 7 (ref 5.0–8.0)

## 2021-08-17 LAB — CBC WITH DIFFERENTIAL/PLATELET
Abs Immature Granulocytes: 0.02 10*3/uL (ref 0.00–0.07)
Basophils Absolute: 0 10*3/uL (ref 0.0–0.1)
Basophils Relative: 1 %
Eosinophils Absolute: 0 10*3/uL (ref 0.0–0.5)
Eosinophils Relative: 0 %
HCT: 38.2 % (ref 36.0–46.0)
Hemoglobin: 12.5 g/dL (ref 12.0–15.0)
Immature Granulocytes: 0 %
Lymphocytes Relative: 21 %
Lymphs Abs: 1.2 10*3/uL (ref 0.7–4.0)
MCH: 28.8 pg (ref 26.0–34.0)
MCHC: 32.7 g/dL (ref 30.0–36.0)
MCV: 88 fL (ref 80.0–100.0)
Monocytes Absolute: 0.3 10*3/uL (ref 0.1–1.0)
Monocytes Relative: 5 %
Neutro Abs: 4.2 10*3/uL (ref 1.7–7.7)
Neutrophils Relative %: 73 %
Platelets: 380 10*3/uL (ref 150–400)
RBC: 4.34 MIL/uL (ref 3.87–5.11)
RDW: 12.4 % (ref 11.5–15.5)
WBC: 5.7 10*3/uL (ref 4.0–10.5)
nRBC: 0 % (ref 0.0–0.2)

## 2021-08-17 LAB — BASIC METABOLIC PANEL
Anion gap: 6 (ref 5–15)
BUN: 12 mg/dL (ref 6–20)
CO2: 29 mmol/L (ref 22–32)
Calcium: 9.3 mg/dL (ref 8.9–10.3)
Chloride: 104 mmol/L (ref 98–111)
Creatinine, Ser: 0.83 mg/dL (ref 0.44–1.00)
GFR, Estimated: >60 mL/min (ref >60)
Glucose, Bld: 116 mg/dL — ABNORMAL HIGH (ref 70–99)
Potassium: 3.7 mmol/L (ref 3.5–5.1)
Sodium: 139 mmol/L (ref 135–145)

## 2021-08-17 LAB — POC URINE PREG, ED: Preg Test, Ur: NEGATIVE

## 2021-08-17 LAB — TROPONIN I (HIGH SENSITIVITY): Troponin I (High Sensitivity): <2 ng/L (ref <18)

## 2021-08-17 MED ORDER — ACETAMINOPHEN 325 MG PO TABS
650.0000 mg | ORAL_TABLET | Freq: Once | ORAL | Status: AC
  Administered 2021-08-17: 14:00:00 650 mg via ORAL
  Filled 2021-08-17: qty 2

## 2021-08-17 MED ORDER — ONDANSETRON 4 MG PO TBDP
4.0000 mg | ORAL_TABLET | Freq: Once | ORAL | Status: AC
  Administered 2021-08-17: 14:00:00 4 mg via ORAL
  Filled 2021-08-17: qty 1

## 2021-08-17 NOTE — Discharge Instructions (Signed)
Your blood work, EKG and vital signs were all reassuring.  It is unclear exactly what the cause of this event was but may have been related to a panic attack.  If you have recurrent symptoms or develop any new symptoms that are concerning to you such as chest pain palpitations

## 2021-08-17 NOTE — ED Triage Notes (Signed)
Pt in with hypertension (260's/100's) that began on the way to work this am. States she was driving to work, began to feel near syncopal, and then took her normal  BP meds. EMS came to see her when she arrived to work, and her BP was beginning to come down at this point. 116/89 in triage. Denies any blurred vision or neuro deficits

## 2021-08-17 NOTE — ED Provider Notes (Signed)
Emergency Medicine Provider Triage Evaluation Note  Sonya Lowery, a 55 y.o. female  was evaluated in triage.  Pt complains of her blood pressure.  Patient reports an elevated BP of 260s over 100s, that began on her way to work this morning at about 830.  She was driving to work began to feel as though she is in a pass out.  She took her BP meds about 10 minutes before EMS arrived.  They arrived and began to note downtrending blood pressures.  Patient denies any visual disturbance, facial droop, or paralysis.  She presents now with a headache and some mild nausea.  Review of Systems  Positive: High BP, near-syncope Negative: CP, SOB  Physical Exam  BP 116/74   Pulse 65   Temp (!) 97.4 F (36.3 C) (Oral)   Resp 18   Wt 99.8 kg   LMP 09/30/2016 (Exact Date) Comment: denies preg  SpO2 100%   BMI 32.03 kg/m  Gen:   Awake, no distress  NAD Resp:  Normal effort CTA MSK:   Moves extremities without difficulty  Other:  CVS: RRR  Medical Decision Making  Medically screening exam initiated at 2:02 PM.  Appropriate orders placed.  Clarabell Matsuoka was informed that the remainder of the evaluation will be completed by another provider, this initial triage assessment does not replace that evaluation, and the importance of remaining in the ED until their evaluation is complete.  Presents to the ED with complaints of elevated BP that is now normalized.  She endorses only mild headache and associated nausea at this time.   Lissa Hoard, PA-C 08/17/21 1404    Georga Hacking, MD 08/17/21 [phone]

## 2021-08-17 NOTE — ED Provider Notes (Signed)
[facility]  ____________________________________________   Event Date/Time   First MD Initiated Contact with Patient 08/17/21 1419     (approximate)  I have reviewed the triage vital signs and the nursing notes.   HISTORY  Chief Complaint Hypertension    HPI Sonya Lowery is a 55 y.o. female  past medical history of hypertension who presents with elevated blood pressure.  Patient was driving to work was in her normal state of health when she started to feel heaviness in her head and felt like she may pass out.  She denies any chest pain palpitations or dyspnea at this time but did have some nausea and sweating.  When she got he was able to walk inside and her boss noticed that she did not look well, they called EMS.  The patient denies any visual change or focal numbness weakness or difficulty speaking at the time.  She denies feeling excessively anxious.  When fire rescue got there her blood pressure was significantly elevated with a systolic in the [id number].  By the time EMS arrived her blood pressure was dropping.  Currently she does feel some pressure-like sensation in her head but denies again denies chest pain blurry vision or focal neurologic symptoms.  Patient's husband said that she has had a similar episode like this in the for which she describes as a panic attack.         Past Medical History:  Diagnosis Date   Hypertension    Thyroid disease     There are no problems to display for this patient.   Past Surgical History:  Procedure Laterality Date   HERNIA REPAIR      Prior to Admission medications   Medication Sig Start Date End Date Taking? Authorizing Provider  ferrous sulfate 325 (65 FE) MG EC tablet Take 325 mg by mouth 3 (three) times daily with meals.    [provider]  levothyroxine (SYNTHROID, LEVOTHROID) 175 MCG tablet Take 175 mcg by mouth daily before breakfast.    [provider]  levothyroxine (SYNTHROID,  LEVOTHROID) 25 MCG tablet Take 175 mcg by mouth daily before breakfast.     [provider]  lisinopril-hydrochlorothiazide (PRINZIDE,ZESTORETIC) 20-25 MG tablet Take 1 tablet by mouth daily.    [provider]    Allergies Patient has no known allergies.  No family history on file.  Social History Social History   Tobacco Use   Smoking status: Never   Smokeless tobacco: Never  Substance Use Topics   Alcohol use: Yes    Review of Systems   Review of Systems  Constitutional:  Negative for chills and fever.  Eyes:  Negative for visual disturbance.  Respiratory:  Negative for shortness of breath.   Cardiovascular:  Negative for chest pain.  Gastrointestinal:  Positive for nausea. Negative for abdominal pain and vomiting.  Neurological:  Positive for dizziness and headaches. Negative for weakness and numbness.  All other systems reviewed and are negative.  Physical Exam Updated Vital Signs BP (!) 144/99   Pulse 64   Temp (!) 97.4 F (36.3 C) (Oral)   Resp 16   Wt 99.8 kg   LMP 09/30/2016 (Exact Date) Comment: denies preg  SpO2 99%   BMI 32.03 kg/m   Physical Exam Vitals and nursing note reviewed.  Constitutional:      General: She is not in acute distress.    Appearance: Normal appearance.  HENT:     Head: Normocephalic and atraumatic.  Eyes:  General: No scleral icterus.    Conjunctiva/sclera: Conjunctivae normal.  Cardiovascular:     Rate and Rhythm: Normal rate and regular rhythm.  Pulmonary:     Effort: Pulmonary effort is normal. No respiratory distress.     Breath sounds: No stridor.  Abdominal:     General: Abdomen is flat.     Palpations: Abdomen is soft.  Musculoskeletal:        General: No deformity or signs of injury.     Cervical back: Normal range of motion.  Skin:    General: Skin is dry.     Coloration: Skin is not jaundiced or pale.  Neurological:     General: No focal deficit present.     Mental Status: She is alert  and oriented to person, place, and time. Mental status is at baseline.     Comments: Aox3, nml speech  PERRL, EOMI, face symmetric, nml tongue movement  5/5 strength in the BL upper and lower extremities  Sensation grossly intact in the BL upper and lower extremities  Finger-nose-finger intact BL   Psychiatric:        Mood and Affect: Mood normal.        Behavior: Behavior normal.     LABS (all labs ordered are listed, but only abnormal results are displayed)  Labs Reviewed  BASIC METABOLIC PANEL - Abnormal; Notable for the following components:      Result Value   Glucose, Bld 116 (*)    All other components within normal limits  URINALYSIS, ROUTINE W REFLEX MICROSCOPIC - Abnormal; Notable for the following components:   APPearance CLEAR (*)    Bacteria, UA RARE (*)    All other components within normal limits  CBC WITH DIFFERENTIAL/PLATELET  POC URINE PREG, ED  TROPONIN I (HIGH SENSITIVITY)   ____________________________________________  EKG  Incomplete on bundle branch block, flattened T waves throughout, normal sinus rhythm with sinus arrhythmia, no acute ischemic changes ____________________________________________  RADIOLOGY Ky Barban, personally viewed and evaluated these images (plain radiographs) as part of my medical decision making, as well as reviewing the written report by the radiologist.  ED MD interpretation:  n/a    ____________________________________________   PROCEDURES  Procedure(s) performed (including Critical Care):  Procedures   ____________________________________________   INITIAL IMPRESSION / ASSESSMENT AND PLAN / ED COURSE     55 year old female presents with a episode of presyncope.  Started while driving, came out of nowhere and described as feeling heavy in her head, lightheaded and nauseous.  She was hypertensive initially and by the time she arrived to the ED blood pressure is now within normal limits.  She  endorses some ongoing pressure in her head but otherwise she has no neurologic symptoms denies chest pain shortness of breath or other anginal symptoms.  On exam patient is well-appearing.  She has a normal neurologic exam.  Plan to obtain EKG labs and a urine pregnancy test.  Differential includes vasovagal episode, cardiac arrhythmia, cardiac ischemia versus panic attack.   EKG is reassuring.  Pregnancy test negative.  Patient feeling back to baseline and blood pressure improved.  She is stable for discharge.  ____________________________________________   FINAL CLINICAL IMPRESSION(S) / ED DIAGNOSES  Final diagnoses:  Lightheadedness     ED Discharge Orders     None        Note:  This document was prepared using Dragon voice recognition software and may include unintentional dictation errors.    Georga Hacking, MD 08/19/21  0026  

## 2021-09-16 ENCOUNTER — Other Ambulatory Visit: Payer: Self-pay | Admitting: Pediatrics

## 2021-09-16 DIAGNOSIS — Z1231 Encounter for screening mammogram for malignant neoplasm of breast: Secondary | ICD-10-CM

## 2021-09-30 ENCOUNTER — Other Ambulatory Visit: Payer: Self-pay

## 2021-09-30 ENCOUNTER — Ambulatory Visit
Admission: RE | Admit: 2021-09-30 | Discharge: 2021-09-30 | Disposition: A | Discharge status: Home / Self Care | Payer: Managed Care, Other (non HMO) | Source: Ambulatory Visit | Attending: Pediatrics | Admitting: Pediatrics

## 2021-09-30 DIAGNOSIS — Z1231 Encounter for screening mammogram for malignant neoplasm of breast: Secondary | ICD-10-CM | POA: Insufficient documentation

## 2022-06-27 ENCOUNTER — Emergency Department (HOSPITAL_COMMUNITY)
Admission: EM | Admit: 2022-06-27 | Discharge: 2022-06-28 | Discharge status: Against Medical Advice | Payer: BC Managed Care – PPO | Attending: Student | Admitting: Student

## 2022-06-27 ENCOUNTER — Emergency Department (HOSPITAL_COMMUNITY): Payer: BC Managed Care – PPO

## 2022-06-27 ENCOUNTER — Other Ambulatory Visit: Payer: Self-pay

## 2022-06-27 DIAGNOSIS — R079 Chest pain, unspecified: Secondary | ICD-10-CM | POA: Diagnosis present

## 2022-06-27 DIAGNOSIS — Z5321 Procedure and treatment not carried out due to patient leaving prior to being seen by health care provider: Secondary | ICD-10-CM | POA: Diagnosis not present

## 2022-06-27 LAB — CBC
HCT: 38.7 % (ref 36.0–46.0)
Hemoglobin: 12.9 g/dL (ref 12.0–15.0)
MCH: 29.7 pg (ref 26.0–34.0)
MCHC: 33.3 g/dL (ref 30.0–36.0)
MCV: 89 fL (ref 80.0–100.0)
Platelets: 380 10*3/uL (ref 150–400)
RBC: 4.35 MIL/uL (ref 3.87–5.11)
RDW: 12.6 % (ref 11.5–15.5)
WBC: 4.2 10*3/uL (ref 4.0–10.5)
nRBC: 0 % (ref 0.0–0.2)

## 2022-06-27 LAB — BASIC METABOLIC PANEL
Anion gap: 8 (ref 5–15)
BUN: 7 mg/dL (ref 6–20)
CO2: 29 mmol/L (ref 22–32)
Calcium: 9.1 mg/dL (ref 8.9–10.3)
Chloride: 105 mmol/L (ref 98–111)
Creatinine, Ser: 0.93 mg/dL (ref 0.44–1.00)
GFR, Estimated: >60 mL/min (ref >60)
Glucose, Bld: 138 mg/dL — ABNORMAL HIGH (ref 70–99)
Potassium: 3.8 mmol/L (ref 3.5–5.1)
Sodium: 142 mmol/L (ref 135–145)

## 2022-06-27 LAB — TROPONIN I (HIGH SENSITIVITY): Troponin I (High Sensitivity): 3 ng/L (ref <18)

## 2022-06-27 NOTE — ED Provider Triage Note (Signed)
Emergency Medicine Provider Triage Evaluation Note  Sonya Lowery , a 56 y.o. female  was evaluated in triage.  Pt complains of chest pain x1 week.  Became constant 2 days ago, left-sided does not radiate.  Denies nausea, vomiting shortness of breath.  No history of ACS.  Took Advil and Tylenol prior to arrival with no improvement of her pain..  Review of Systems  Per HPI  Physical Exam  BP (!) 182/105 (BP Location: Right Arm)   Pulse 71   Temp 98.7 F (37.1 C) (Oral)   Resp 19   LMP 09/30/2016 (Exact Date) Comment: denies preg  SpO2 100%  Gen:   Awake, no distress   Resp:  Normal effort  MSK:   Moves extremities without difficulty  Other:    Medical Decision Making  Medically screening exam initiated at 9:21 PM.  Appropriate orders placed.  Sonya Lowery was informed that the remainder of the evaluation will be completed by another provider, this initial triage assessment does not replace that evaluation, and the importance of remaining in the ED until their evaluation is complete.     Sherrill Raring, PA-C 06/27/22 2122

## 2022-06-27 NOTE — ED Triage Notes (Signed)
Pt here for R side cp that has been intermittent x1 week but got worse and has been constant since Friday. Pt reports pain radiates to R shoulder and has had some trouble catching her breath. Pt denies nausea/vomiting or lightheadedness

## 2022-06-28 ENCOUNTER — Other Ambulatory Visit: Payer: Self-pay

## 2022-06-28 ENCOUNTER — Emergency Department
Admission: EM | Admit: 2022-06-28 | Discharge: 2022-06-28 | Disposition: A | Discharge status: Home / Self Care | Payer: BC Managed Care – PPO | Attending: Emergency Medicine | Admitting: Emergency Medicine

## 2022-06-28 DIAGNOSIS — I10 Essential (primary) hypertension: Secondary | ICD-10-CM | POA: Insufficient documentation

## 2022-06-28 DIAGNOSIS — B029 Zoster without complications: Secondary | ICD-10-CM

## 2022-06-28 DIAGNOSIS — Z1152 Encounter for screening for COVID-19: Secondary | ICD-10-CM | POA: Diagnosis not present

## 2022-06-28 DIAGNOSIS — R079 Chest pain, unspecified: Secondary | ICD-10-CM | POA: Diagnosis present

## 2022-06-28 LAB — RESP PANEL BY RT-PCR (FLU A&B, COVID) ARPGX2
Influenza A by PCR: NEGATIVE
Influenza B by PCR: NEGATIVE
SARS Coronavirus 2 by RT PCR: NEGATIVE

## 2022-06-28 LAB — BASIC METABOLIC PANEL
Anion gap: 6 (ref 5–15)
BUN: 10 mg/dL (ref 6–20)
CO2: 27 mmol/L (ref 22–32)
Calcium: 9.1 mg/dL (ref 8.9–10.3)
Chloride: 107 mmol/L (ref 98–111)
Creatinine, Ser: 0.99 mg/dL (ref 0.44–1.00)
GFR, Estimated: >60 mL/min (ref >60)
Glucose, Bld: 124 mg/dL — ABNORMAL HIGH (ref 70–99)
Potassium: 3.2 mmol/L — ABNORMAL LOW (ref 3.5–5.1)
Sodium: 140 mmol/L (ref 135–145)

## 2022-06-28 LAB — CBC
HCT: 40.5 % (ref 36.0–46.0)
Hemoglobin: 13 g/dL (ref 12.0–15.0)
MCH: 28.8 pg (ref 26.0–34.0)
MCHC: 32.1 g/dL (ref 30.0–36.0)
MCV: 89.8 fL (ref 80.0–100.0)
Platelets: 349 10*3/uL (ref 150–400)
RBC: 4.51 MIL/uL (ref 3.87–5.11)
RDW: 12.7 % (ref 11.5–15.5)
WBC: 4.2 10*3/uL (ref 4.0–10.5)
nRBC: 0 % (ref 0.0–0.2)

## 2022-06-28 LAB — TROPONIN I (HIGH SENSITIVITY)
Troponin I (High Sensitivity): <2 ng/L (ref <18)
Troponin I (High Sensitivity): 3 ng/L (ref <18)

## 2022-06-28 LAB — D-DIMER, QUANTITATIVE: D-Dimer, Quant: 0.32 ug/mL-FEU (ref 0.00–0.50)

## 2022-06-28 MED ORDER — LIDOCAINE 5 % EX PTCH
1.0000 | MEDICATED_PATCH | Freq: Once | CUTANEOUS | Status: DC
  Administered 2022-06-28: 1 via TRANSDERMAL
  Filled 2022-06-28: qty 1

## 2022-06-28 MED ORDER — VALACYCLOVIR HCL 1 G PO TABS: 1000.0000 mg | ORAL_TABLET | Freq: Three times a day (TID) | ORAL | 0 refills | Status: AC

## 2022-06-28 MED ORDER — ACETAMINOPHEN 500 MG PO TABS
1000.0000 mg | ORAL_TABLET | Freq: Once | ORAL | Status: AC
  Administered 2022-06-28: 1000 mg via ORAL
  Filled 2022-06-28: qty 2

## 2022-06-28 MED ORDER — POTASSIUM CHLORIDE CRYS ER 20 MEQ PO TBCR: 40.0000 meq | EXTENDED_RELEASE_TABLET | Freq: Once | ORAL | 0 refills | Status: AC

## 2022-06-28 MED ORDER — LIDOCAINE 5 % EX PTCH: 1.0000 | MEDICATED_PATCH | Freq: Two times a day (BID) | CUTANEOUS | 0 refills | Status: AC

## 2022-06-28 MED ORDER — OXYCODONE HCL 5 MG PO TABS: 5.0000 mg | ORAL_TABLET | Freq: Three times a day (TID) | ORAL | 0 refills | Status: AC | PRN

## 2022-06-28 MED ORDER — KETOROLAC TROMETHAMINE 15 MG/ML IJ SOLN
15.0000 mg | Freq: Once | INTRAMUSCULAR | Status: AC
  Administered 2022-06-28: 15 mg via INTRAVENOUS
  Filled 2022-06-28: qty 1

## 2022-06-28 NOTE — ED Notes (Addendum)
Pt sitting in chair, dressed, arms folded, frustrated and upset. Questioning labs with other RN. This RN went into answer questions and explain rationale of blood work. Pt declining and wants to speak with EDP. EDP notified/ messaged. Unable to add d-dime, lab has know blue top tube. Pending d-dimer and repeat trop.

## 2022-06-28 NOTE — ED Notes (Addendum)
Rationale and explanations for tests, process, and timeframe given. Pt not appeased.  Pt tearful, questioning, and frustrated. Pending discussion with EDP. Eloped from [facility] ED yesterday.

## 2022-06-28 NOTE — ED Notes (Addendum)
EDP remains at [facility]

## 2022-06-28 NOTE — ED Notes (Signed)
Pt left. Pt was advised to stay.  ?

## 2022-06-28 NOTE — ED Provider Notes (Signed)
[facility] Provider Note    Event Date/Time   First MD Initiated Contact with Patient 06/28/22 1326     (approximate)   History   Chest Pain   HPI  Trust Crago is a 56 y.o. female  With hypertension who comes in with concerns for chest pain.  Patient reports right-sided chest pain wrapping underneath her breast going up into her chest.  She does report some intermittent shortness of breath associated with it.  She reports having similar symptoms before and being told that it was related to her back.  She denies any weakness, numbness, saddle anesthesia, urinary or rectal incontinence.  No history of blood clots but her sister did have a blood clot.   Physical Exam   Triage Vital Signs: ED Triage Vitals  Enc Vitals Group     BP 06/28/22 0948 (!) 162/104     Pulse Rate 06/28/22 0948 71     Resp 06/28/22 0948 19     Temp 06/28/22 0948 98 F (36.7 C)     Temp Source 06/28/22 1356 Oral     SpO2 06/28/22 0948 100 %     Weight 06/28/22 1338 220 lb 0.3 oz (99.8 kg)     Height 06/28/22 1338 5' 9.5" (1.765 m)     Head Circumference --      Peak Flow --      Pain Score 06/28/22 0949 6     Pain Loc --      Pain Edu? --      Excl. in GC? --     Most recent vital signs: Vitals:   06/28/22 0948 06/28/22 1356  BP: (!) 162/104 (!) 158/100  Pulse: 71 70  Resp: 19 18  Temp: 98 F (36.7 C) 98 F (36.7 C)  SpO2: 100% 100%     General: Awake, no distress.  CV:  Good peripheral perfusion.  Resp:  Normal effort.  Abd:  No distention.  Other:  Patient has 4 scattered areas on the right side of the back of pustules along the same dermatome.  They do not cross midline   ED Results / Procedures / Treatments   Labs (all labs ordered are listed, but only abnormal results are displayed) Labs Reviewed  BASIC METABOLIC PANEL - Abnormal; Notable for the following components:      Result Value   Potassium 3.2 (*)    Glucose, Bld 124 (*)    All other  components within normal limits  RESP PANEL BY RT-PCR (FLU A&B, COVID) ARPGX2  CBC  D-DIMER, QUANTITATIVE  TROPONIN I (HIGH SENSITIVITY)  TROPONIN I (HIGH SENSITIVITY)     EKG  My interpretation of EKG: Normal sinus rhythm 70 without any ST elevation, T wave version lead III and aVF, normal intervals.  Reviewed patient's prior EKG and she had similar T wave inversions   RADIOLOGY I have reviewed the xray personally and interpreted no evidence of any pneumonia  PROCEDURES:  Critical Care performed: No  Procedures   MEDICATIONS ORDERED IN ED: Medications  lidocaine (LIDODERM) 5 % 1 patch (1 patch Transdermal Patch Applied 06/28/22 1426)  acetaminophen (TYLENOL) tablet 1,000 mg (1,000 mg Oral Given 06/28/22 1425)  ketorolac (TORADOL) 15 MG/ML injection 15 mg (15 mg Intravenous Given 06/28/22 1425)     IMPRESSION / MDM / ASSESSMENT AND PLAN / ED COURSE  I reviewed the triage vital signs and the nursing notes.   Patient's presentation is most consistent with acute presentation with potential  threat to life or bodily function.   Differential includes ACS, PE.  Patient is low Wells score.  On my examination I did see shingles.  There is a very small area of does not cross any other dermatome but 1.  It does not cross midline.  She is not immunosuppressed.  She did report noticing a little bit of a rash early this morning.  No prior rash before then just the pain.  We will proceed with D-dimer given the shortness of breath just to ensure no PE given positive family history   D-dimer negative troponin negative x2.  BMP slightly low potassium we will give some oral repletion.  CBC reassuring.  Discussed with patient started on treatment for shingles.  Recommended avoiding people with pregnancy until the scabs are crusted over provided a work note.  Recommended a recheck of her blood pressure with her primary care doctor when she is doing better`  Provided short course of  opiods    FINAL CLINICAL IMPRESSION(S) / ED DIAGNOSES   Final diagnoses:  Herpes zoster without complication     Rx / DC Orders   ED Discharge Orders          Ordered    lidocaine (LIDODERM) 5 %  Every 12 hours        06/28/22 1525    valACYclovir (VALTREX) 1000 MG tablet  3 times daily        06/28/22 1525    oxyCODONE (ROXICODONE) 5 MG immediate release tablet  Every 8 hours PRN        06/28/22 1525             Note:  This document was prepared using Dragon voice recognition software and may include unintentional dictation errors.   Vanessa [place], MD 06/28/22 [phone]

## 2022-06-28 NOTE — Discharge Instructions (Addendum)
Take Tylenol 1 g every 8 hours, ibuprofen 600 every 6-8 hours and return to the ER if develop worsening pain or any other concerns.  Use the lidocaine patches as well as use oxycodone do not drive or work while on this.  Take the antiviral medicine.  Stay away from people who are pregnant- use good hand hygiene.   Call your primary care doctor to make a recheck of your blood pressure given it was slightly elevated today but it could be related to the shingles and pain.

## 2022-06-28 NOTE — ED Triage Notes (Signed)
Pt comes with c/o right sided chest pain that radiates under breast and to back the patient states this started week ago and has gotten worse. Pt states SOB.

## 2022-06-28 NOTE — ED Provider Triage Note (Signed)
Emergency Medicine Provider Triage Evaluation Note  Sonya Lowery , a 56 y.o. female  was evaluated in triage.  Pt complains of right side chest pain for about a week. She feels like it is back/pinched nerve related. Similar symptoms about 15 years ago. No cardiac history. She went to [facility] last night, but left before being seen.  Physical Exam  LMP 09/30/2016 (Exact Date) Comment: denies preg Gen:   Awake, no distress   Resp:  Normal effort  MSK:   Moves extremities without difficulty  Other:    Medical Decision Making  Medically screening exam initiated at 9:48 AM.  Appropriate orders placed.  Nikitia Asbill was informed that the remainder of the evaluation will be completed by another provider, this initial triage assessment does not replace that evaluation, and the importance of remaining in the ED until their evaluation is complete.    Victorino Dike, FNP 06/28/22 1007

## 2022-06-28 NOTE — ED Notes (Signed)
EDP at [facility] 

## 2023-03-21 ENCOUNTER — Emergency Department
Admission: EM | Admit: 2023-03-21 | Discharge: 2023-03-21 | Disposition: A | Discharge status: Home / Self Care | Payer: BC Managed Care – PPO | Attending: Emergency Medicine | Admitting: Emergency Medicine

## 2023-03-21 ENCOUNTER — Other Ambulatory Visit: Payer: Self-pay

## 2023-03-21 ENCOUNTER — Encounter: Payer: Self-pay | Admitting: Radiology

## 2023-03-21 DIAGNOSIS — M5441 Lumbago with sciatica, right side: Secondary | ICD-10-CM | POA: Diagnosis not present

## 2023-03-21 DIAGNOSIS — M549 Dorsalgia, unspecified: Secondary | ICD-10-CM | POA: Diagnosis present

## 2023-03-21 DIAGNOSIS — M5412 Radiculopathy, cervical region: Secondary | ICD-10-CM

## 2023-03-21 DIAGNOSIS — M5431 Sciatica, right side: Secondary | ICD-10-CM

## 2023-03-21 DIAGNOSIS — I10 Essential (primary) hypertension: Secondary | ICD-10-CM | POA: Insufficient documentation

## 2023-03-21 DIAGNOSIS — E039 Hypothyroidism, unspecified: Secondary | ICD-10-CM | POA: Insufficient documentation

## 2023-03-21 NOTE — Discharge Instructions (Signed)
Continue taking Tylenol and ibuprofen.  You can follow-up with your PCP.  Schedule an appointment with Dr. Joice Lofts who is the orthopedic doctor or one recommended by your PCP.  You can also go to [facility] urgent care: [address] [place], [place], [place] [address] They are open 8 AM-9 PM Monday through Friday and 9 AM to 9 PM Saturday and Sunday.

## 2023-03-21 NOTE — ED Triage Notes (Signed)
Pt presents with back pain since June 1st. PT states she was moving a box when this started happening. Pt states she now has pain down her leg into her feet and now has a numb toe. Pt states now her left arm has began to hurt.

## 2023-03-21 NOTE — ED Provider Notes (Signed)
[facility] Provider Note    Event Date/Time   First MD Initiated Contact with Patient 03/21/23 1920     (approximate)   History   Back Pain   HPI  Sonya Lowery is a 57 y.o. female with PMH of hypothyroid and hypertension who presents for evaluation of right-sided back pain that radiates down the leg and left-sided arm pain.  Patient's pain first began on June 1 when she was moving a box.  It started in the lower right side.  Now she is having pain in her left arm.  Patient denies tingling and weakness in her arm and leg.  She does state that she has numbness in her second toe on the right foot.  Patient has been taking Tylenol and ibuprofen as advised by her PCP consistently.     Physical Exam   Triage Vital Signs: ED Triage Vitals [03/21/23 1852]  Enc Vitals Group     BP (!) 141/94     Pulse Rate 75     Resp 18     Temp 98.1 F (36.7 C)     Temp Source Oral     SpO2 98 %     Weight 222 lb (100.7 kg)     Height 5\' 9"  (1.753 m)     Head Circumference      Peak Flow      Pain Score      Pain Loc      Pain Edu?      Excl. in GC?     Most recent vital signs: Vitals:   03/21/23 1852  BP: (!) 141/94  Pulse: 75  Resp: 18  Temp: 98.1 F (36.7 C)  SpO2: 98%     General: Awake, no distress.  CV:  Good peripheral perfusion.  Resp:  Normal effort.  Abd:  No distention.  Other:  No point tenderness along the spine, 5/5 strength in upper and lower extremity.  Sensation intact across all dermatomes in the extremities.   ED Results / Procedures / Treatments   Labs (all labs ordered are listed, but only abnormal results are displayed) Labs Reviewed - No data to display   PROCEDURES:  Critical Care performed: No  Procedures   MEDICATIONS ORDERED IN ED: Medications - No data to display   IMPRESSION / MDM / ASSESSMENT AND PLAN / ED COURSE  I reviewed the triage vital signs and the nursing notes.                              57 year old female presents for evaluation of right-sided lower back pain radiating down into the leg and left-sided arm pain.  Vital signs stable in triage aside from an elevated blood pressure, however patient does have history of hypertension.  On exam patient is in NAD.  Differential diagnosis includes, but is not limited to, sciatic nerve pain, muscle strain, cervical radiculopathy, cauda equina.  Patient's presentation is most consistent with acute illness / injury with system symptoms.  I discussed the possibility of getting cervical spine and lumbar x-rays with the patient.  I explained that since she did not have a traumatic injury it likely would not show a fracture.  And given her history x-rays would not change my management.  I believe patient's pain is consistent with a right-sided sciatic nerve pain and a left-sided cervical radiculopathy.  I explained that we cannot evaluate the muscles and nerves with  an x-ray and she would need an MRI, however she is not presenting with signs or symptoms of cauda equina that would warrant an emergent MRI, so she would need to get this done in the outpatient setting.  I offered patient a short course of steroids and a muscle relaxer, however she did not want to take any new medications since she felt she was already taking so many.  She wanted to have imaging done to determine exactly what was going on before taking a new medication.  I gave her information for orthopedic follow-up and [facility] urgent care.  Patient stated she will return to her PCP for an orthopedic referral.  All questions were answered, patient was agreeable to plan and stable at discharge.    FINAL CLINICAL IMPRESSION(S) / ED DIAGNOSES   Final diagnoses:  Sciatica of right side  Cervical radiculopathy     Rx / DC Orders   ED Discharge Orders     None        Note:  This document was prepared using Dragon voice recognition software and may include unintentional  dictation errors.   Cameron Ali, PA-C 03/21/23 2041    Chesley Noon, MD 03/22/23 [phone]

## 2024-04-12 ENCOUNTER — Other Ambulatory Visit: Payer: Self-pay

## 2024-04-12 ENCOUNTER — Encounter: Payer: Self-pay | Admitting: *Deleted

## 2024-04-12 ENCOUNTER — Emergency Department: Payer: Self-pay

## 2024-04-12 ENCOUNTER — Emergency Department
Admission: EM | Admit: 2024-04-12 | Discharge: 2024-04-13 | Disposition: A | Discharge status: Home / Self Care | Payer: Self-pay | Attending: Emergency Medicine | Admitting: Emergency Medicine

## 2024-04-12 DIAGNOSIS — M25551 Pain in right hip: Secondary | ICD-10-CM | POA: Insufficient documentation

## 2024-04-12 DIAGNOSIS — I10 Essential (primary) hypertension: Secondary | ICD-10-CM | POA: Insufficient documentation

## 2024-04-12 DIAGNOSIS — R202 Paresthesia of skin: Secondary | ICD-10-CM | POA: Insufficient documentation

## 2024-04-12 DIAGNOSIS — W19XXXA Unspecified fall, initial encounter: Secondary | ICD-10-CM

## 2024-04-12 DIAGNOSIS — W108XXA Fall (on) (from) other stairs and steps, initial encounter: Secondary | ICD-10-CM | POA: Insufficient documentation

## 2024-04-12 NOTE — ED Triage Notes (Addendum)
 Pt ambulatory to triage.  Pt fell down 6 steps this am inside her home. Pt states her socks made her slip and fall.   Pt has right hip pain.  No loc.  Pt alert.  Pt denies back or neck pain.

## 2024-04-13 MED ORDER — CYCLOBENZAPRINE HCL 5 MG PO TABS: 5.0000 mg | ORAL_TABLET | Freq: Three times a day (TID) | ORAL | 0 refills | Status: DC | PRN

## 2024-04-13 MED ORDER — GABAPENTIN 100 MG PO CAPS: 100.0000 mg | ORAL_CAPSULE | Freq: Three times a day (TID) | ORAL | 0 refills | Status: AC | PRN

## 2024-04-13 MED ORDER — CYCLOBENZAPRINE HCL 5 MG PO TABS: 5.0000 mg | ORAL_TABLET | Freq: Three times a day (TID) | ORAL | 0 refills | Status: AC | PRN

## 2024-04-13 MED ORDER — GABAPENTIN 100 MG PO CAPS
100.0000 mg | ORAL_CAPSULE | Freq: Once | ORAL | Status: AC
  Administered 2024-04-13: 100 mg via ORAL
  Filled 2024-04-13: qty 1

## 2024-04-13 MED ORDER — CYCLOBENZAPRINE HCL 10 MG PO TABS
5.0000 mg | ORAL_TABLET | Freq: Once | ORAL | Status: AC
  Administered 2024-04-13: 5 mg via ORAL
  Filled 2024-04-13: qty 1

## 2024-04-13 MED ORDER — GABAPENTIN 100 MG PO CAPS: 100.0000 mg | ORAL_CAPSULE | Freq: Three times a day (TID) | ORAL | 0 refills | Status: DC | PRN

## 2024-04-13 NOTE — ED Provider Notes (Signed)
 [facility] Provider Note   Event Date/Time   First MD Initiated Contact with Patient 04/12/24 2352     (approximate) History  Fall  HPI Sonya Lowery is a 58 y.o. female with a past medical history of hypertension and Graves' disease who presents after a fall down 6 steps in her home this morning resulting in severe right hip pain.  Patient states that she has worsened pain with walking however it is significantly worsened with sitting or laying down.  Patient also endorses tenderness to palpation overlying this right hip.  Patient denies any loss of consciousness or head trauma.  Patient does endorse associated paresthesias down the back of the right leg intermittently ROS: Patient currently denies any vision changes, tinnitus, difficulty speaking, facial droop, sore throat, chest pain, shortness of breath, abdominal pain, nausea/vomiting/diarrhea, dysuria, or weakness/numbness in any extremity   Physical Exam  Triage Vital Signs: ED Triage Vitals  Encounter Vitals Group     BP 04/12/24 2126 (!) 152/94     Girls Systolic BP Percentile --      Girls Diastolic BP Percentile --      Boys Systolic BP Percentile --      Boys Diastolic BP Percentile --      Pulse Rate 04/12/24 2126 88     Resp 04/12/24 2126 18     Temp 04/12/24 2126 98.8 F (37.1 C)     Temp Source 04/12/24 2126 Oral     SpO2 04/12/24 2126 100 %     Weight 04/12/24 2127 223 lb (101.2 kg)     Height 04/12/24 2127 5' 10 (1.778 m)     Head Circumference --      Peak Flow --      Pain Score 04/12/24 2126 10     Pain Loc --      Pain Education --      Exclude from Growth Chart --    Most recent vital signs: Vitals:   04/12/24 2126  BP: (!) 152/94  Pulse: 88  Resp: 18  Temp: 98.8 F (37.1 C)  SpO2: 100%   General: Awake, oriented x4. CV:  Good peripheral perfusion. Resp:  Normal effort. Abd:  No distention. Other:  Middle-aged obese African-American female resting comfortably in no  acute distress ED Results / Procedures / Treatments  Labs (all labs ordered are listed, but only abnormal results are displayed) Labs Reviewed - No data to display RADIOLOGY ED MD interpretation: Right hip x-ray shows no evidence of acute fracture or dislocation - All radiology independently interpreted and agree with radiology assessment Official radiology report(s): DG Hip Unilat W or Wo Pelvis 2-3 Views Right Result Date: 04/12/2024 CLINICAL DATA:  Injury fall EXAM: DG HIP (WITH OR WITHOUT PELVIS) 2-3V RIGHT COMPARISON:  None Available. FINDINGS: Clips in the pelvis. Pubic symphysis and rami appear intact. No fracture or malalignment. IMPRESSION: No acute osseous abnormality. Electronically Signed   By: Luke Bun M.D.   On: 04/12/2024 22:09   PROCEDURES: Critical Care performed: No Procedures MEDICATIONS ORDERED IN ED: Medications  cyclobenzaprine (FLEXERIL) tablet 5 mg (has no administration in time range)  gabapentin (NEURONTIN) capsule 100 mg (has no administration in time range)   IMPRESSION / MDM / ASSESSMENT AND PLAN / ED COURSE  I reviewed the triage vital signs and the nursing notes.  The patient is on the cardiac monitor to evaluate for evidence of arrhythmia and/or significant heart rate changes. Patient's presentation is most consistent with acute presentation with potential threat to life or bodily function. 58 year old female presents after mechanical fall down 6 steps resulting in right hip pain Given history, exam and workup I have low suspicion for fracture, dislocation, significant ligamentous injury, septic arthritis, gout flare, new autoimmune arthropathy, or gonococcal arthropathy.  Interventions: Right hip x-ray shows no evidence of acute abnormality Analgesia-cyclobenzaprine, gabapentin Rx: Gabapentin, cyclobenzaprine Disposition: Discharge home with strict return precautions and instructions for prompt primary care follow up in  the next week.   FINAL CLINICAL IMPRESSION(S) / ED DIAGNOSES   Final diagnoses:  Fall, initial encounter  Acute right hip pain   Rx / DC Orders   ED Discharge Orders          Ordered    cyclobenzaprine (FLEXERIL) 5 MG tablet  3 times daily PRN        04/13/24 0012    gabapentin (NEURONTIN) 100 MG capsule  3 times daily PRN        04/13/24 0012           Note:  This document was prepared using Dragon voice recognition software and may include unintentional dictation errors.   Fizza Scales K, MD 04/13/24 [phone]
